# Patient Record
Sex: Male | Born: 1945 | Race: Black or African American | Hispanic: No | Marital: Married | Smoking: Former smoker
Health system: Southern US, Community
[De-identification: ages and names within clinical notes are randomized; demographics above are authoritative.]

## PROBLEM LIST (undated history)

## (undated) ENCOUNTER — Emergency Department: Admission: EM | Disposition: A | Discharge status: Still In Hospital | Payer: Medicare Other

## (undated) DIAGNOSIS — I10 Essential (primary) hypertension: Secondary | ICD-10-CM

## (undated) DIAGNOSIS — F329 Major depressive disorder, single episode, unspecified: Secondary | ICD-10-CM

## (undated) DIAGNOSIS — F32A Depression, unspecified: Secondary | ICD-10-CM

## (undated) DIAGNOSIS — M792 Neuralgia and neuritis, unspecified: Secondary | ICD-10-CM

## (undated) DIAGNOSIS — M47812 Spondylosis without myelopathy or radiculopathy, cervical region: Secondary | ICD-10-CM

## (undated) HISTORY — DX: Neuralgia and neuritis, unspecified: M79.2

## (undated) HISTORY — PX: NO PAST SURGERIES: SHX2092

## (undated) HISTORY — DX: Spondylosis without myelopathy or radiculopathy, cervical region: M47.812

---

## 2013-03-16 ENCOUNTER — Emergency Department (HOSPITAL_COMMUNITY)
Admission: EM | Admit: 2013-03-16 | Discharge: 2013-03-16 | Discharge status: Against Medical Advice | Payer: Medicare HMO | Attending: Emergency Medicine | Admitting: Emergency Medicine

## 2013-03-16 ENCOUNTER — Encounter (HOSPITAL_COMMUNITY): Payer: Self-pay | Admitting: Emergency Medicine

## 2013-03-16 DIAGNOSIS — I10 Essential (primary) hypertension: Secondary | ICD-10-CM | POA: Insufficient documentation

## 2013-03-16 HISTORY — DX: Essential (primary) hypertension: I10

## 2013-03-16 NOTE — ED Notes (Signed)
Presents with hypertension, denies headache, denies blurred vision, denies pain. Pt has not taken bp medication in one month due to not having any, primary care doctor would not refill. Pt does not know name or dose of BP medication. bp 191/120.

## 2014-02-26 DIAGNOSIS — Z125 Encounter for screening for malignant neoplasm of prostate: Secondary | ICD-10-CM | POA: Diagnosis not present

## 2014-02-26 DIAGNOSIS — E559 Vitamin D deficiency, unspecified: Secondary | ICD-10-CM | POA: Diagnosis not present

## 2014-02-26 DIAGNOSIS — E669 Obesity, unspecified: Secondary | ICD-10-CM | POA: Diagnosis not present

## 2014-02-26 DIAGNOSIS — E119 Type 2 diabetes mellitus without complications: Secondary | ICD-10-CM | POA: Diagnosis not present

## 2014-02-26 DIAGNOSIS — I10 Essential (primary) hypertension: Secondary | ICD-10-CM | POA: Diagnosis not present

## 2014-02-26 DIAGNOSIS — E785 Hyperlipidemia, unspecified: Secondary | ICD-10-CM | POA: Diagnosis not present

## 2014-03-03 DIAGNOSIS — I10 Essential (primary) hypertension: Secondary | ICD-10-CM | POA: Diagnosis not present

## 2014-03-04 DIAGNOSIS — I10 Essential (primary) hypertension: Secondary | ICD-10-CM | POA: Diagnosis not present

## 2014-05-26 DIAGNOSIS — E785 Hyperlipidemia, unspecified: Secondary | ICD-10-CM | POA: Diagnosis not present

## 2014-05-26 DIAGNOSIS — I10 Essential (primary) hypertension: Secondary | ICD-10-CM | POA: Diagnosis not present

## 2014-05-26 DIAGNOSIS — E119 Type 2 diabetes mellitus without complications: Secondary | ICD-10-CM | POA: Diagnosis not present

## 2014-05-26 DIAGNOSIS — E669 Obesity, unspecified: Secondary | ICD-10-CM | POA: Diagnosis not present

## 2014-06-17 DIAGNOSIS — E119 Type 2 diabetes mellitus without complications: Secondary | ICD-10-CM | POA: Diagnosis not present

## 2014-06-17 DIAGNOSIS — R05 Cough: Secondary | ICD-10-CM | POA: Diagnosis not present

## 2014-06-17 DIAGNOSIS — I10 Essential (primary) hypertension: Secondary | ICD-10-CM | POA: Diagnosis not present

## 2014-06-29 DIAGNOSIS — R05 Cough: Secondary | ICD-10-CM | POA: Diagnosis not present

## 2014-06-29 DIAGNOSIS — I10 Essential (primary) hypertension: Secondary | ICD-10-CM | POA: Diagnosis not present

## 2014-06-29 DIAGNOSIS — E119 Type 2 diabetes mellitus without complications: Secondary | ICD-10-CM | POA: Diagnosis not present

## 2014-07-13 DIAGNOSIS — E669 Obesity, unspecified: Secondary | ICD-10-CM | POA: Diagnosis not present

## 2014-07-13 DIAGNOSIS — E119 Type 2 diabetes mellitus without complications: Secondary | ICD-10-CM | POA: Diagnosis not present

## 2014-07-13 DIAGNOSIS — I10 Essential (primary) hypertension: Secondary | ICD-10-CM | POA: Diagnosis not present

## 2014-07-20 DIAGNOSIS — F250 Schizoaffective disorder, bipolar type: Secondary | ICD-10-CM | POA: Diagnosis not present

## 2014-09-20 ENCOUNTER — Inpatient Hospital Stay
Admission: EM | Admit: 2014-09-20 | Discharge: 2014-09-21 | DRG: 690 | Disposition: A | Discharge status: Home / Self Care | Payer: Medicare Other | Attending: Internal Medicine | Admitting: Internal Medicine

## 2014-09-20 ENCOUNTER — Emergency Department: Payer: Medicare Other

## 2014-09-20 ENCOUNTER — Encounter: Payer: Self-pay | Admitting: Emergency Medicine

## 2014-09-20 DIAGNOSIS — E860 Dehydration: Secondary | ICD-10-CM | POA: Diagnosis present

## 2014-09-20 DIAGNOSIS — N400 Enlarged prostate without lower urinary tract symptoms: Secondary | ICD-10-CM | POA: Diagnosis present

## 2014-09-20 DIAGNOSIS — Z87891 Personal history of nicotine dependence: Secondary | ICD-10-CM | POA: Diagnosis not present

## 2014-09-20 DIAGNOSIS — A419 Sepsis, unspecified organism: Secondary | ICD-10-CM | POA: Diagnosis present

## 2014-09-20 DIAGNOSIS — M545 Low back pain, unspecified: Secondary | ICD-10-CM

## 2014-09-20 DIAGNOSIS — R05 Cough: Secondary | ICD-10-CM | POA: Diagnosis not present

## 2014-09-20 DIAGNOSIS — R6520 Severe sepsis without septic shock: Secondary | ICD-10-CM | POA: Diagnosis not present

## 2014-09-20 DIAGNOSIS — N289 Disorder of kidney and ureter, unspecified: Secondary | ICD-10-CM | POA: Diagnosis present

## 2014-09-20 DIAGNOSIS — Z79899 Other long term (current) drug therapy: Secondary | ICD-10-CM | POA: Diagnosis not present

## 2014-09-20 DIAGNOSIS — F329 Major depressive disorder, single episode, unspecified: Secondary | ICD-10-CM | POA: Diagnosis present

## 2014-09-20 DIAGNOSIS — N179 Acute kidney failure, unspecified: Secondary | ICD-10-CM | POA: Diagnosis present

## 2014-09-20 DIAGNOSIS — R509 Fever, unspecified: Secondary | ICD-10-CM

## 2014-09-20 DIAGNOSIS — R200 Anesthesia of skin: Secondary | ICD-10-CM | POA: Diagnosis not present

## 2014-09-20 DIAGNOSIS — N12 Tubulo-interstitial nephritis, not specified as acute or chronic: Secondary | ICD-10-CM | POA: Diagnosis present

## 2014-09-20 DIAGNOSIS — F339 Major depressive disorder, recurrent, unspecified: Secondary | ICD-10-CM | POA: Diagnosis present

## 2014-09-20 DIAGNOSIS — Z8249 Family history of ischemic heart disease and other diseases of the circulatory system: Secondary | ICD-10-CM | POA: Diagnosis not present

## 2014-09-20 DIAGNOSIS — Z801 Family history of malignant neoplasm of trachea, bronchus and lung: Secondary | ICD-10-CM | POA: Diagnosis not present

## 2014-09-20 DIAGNOSIS — N390 Urinary tract infection, site not specified: Secondary | ICD-10-CM

## 2014-09-20 DIAGNOSIS — N3000 Acute cystitis without hematuria: Secondary | ICD-10-CM | POA: Diagnosis present

## 2014-09-20 DIAGNOSIS — M5441 Lumbago with sciatica, right side: Secondary | ICD-10-CM | POA: Diagnosis present

## 2014-09-20 DIAGNOSIS — I10 Essential (primary) hypertension: Secondary | ICD-10-CM | POA: Diagnosis present

## 2014-09-20 DIAGNOSIS — N3090 Cystitis, unspecified without hematuria: Secondary | ICD-10-CM | POA: Diagnosis not present

## 2014-09-20 DIAGNOSIS — D72829 Elevated white blood cell count, unspecified: Secondary | ICD-10-CM

## 2014-09-20 HISTORY — DX: Depression, unspecified: F32.A

## 2014-09-20 HISTORY — DX: Major depressive disorder, single episode, unspecified: F32.9

## 2014-09-20 LAB — CBC WITH DIFFERENTIAL/PLATELET
BASOS PCT: 0 %
Basophils Absolute: 0 10*3/uL (ref 0–0.1)
Eosinophils Absolute: 0 10*3/uL (ref 0–0.7)
Eosinophils Relative: 0 %
HCT: 42.8 % (ref 40.0–52.0)
Hemoglobin: 14.4 g/dL (ref 13.0–18.0)
LYMPHS ABS: 0.9 10*3/uL — AB (ref 1.0–3.6)
Lymphocytes Relative: 4 %
MCH: 27.4 pg (ref 26.0–34.0)
MCHC: 33.6 g/dL (ref 32.0–36.0)
MCV: 81.4 fL (ref 80.0–100.0)
MONO ABS: 2.2 10*3/uL — AB (ref 0.2–1.0)
Monocytes Relative: 10 %
NEUTROS ABS: 19.1 10*3/uL — AB (ref 1.4–6.5)
Neutrophils Relative %: 86 %
Platelets: 284 10*3/uL (ref 150–440)
RBC: 5.25 MIL/uL (ref 4.40–5.90)
RDW: 13.9 % (ref 11.5–14.5)
WBC: 22.2 10*3/uL — ABNORMAL HIGH (ref 3.8–10.6)

## 2014-09-20 LAB — BASIC METABOLIC PANEL
Anion gap: 11 (ref 5–15)
BUN: 41 mg/dL — AB (ref 6–20)
CHLORIDE: 102 mmol/L (ref 101–111)
CO2: 23 mmol/L (ref 22–32)
CREATININE: 2.13 mg/dL — AB (ref 0.61–1.24)
Calcium: 9 mg/dL (ref 8.9–10.3)
GFR calc Af Amer: 34 mL/min — ABNORMAL LOW (ref >60)
GFR calc non Af Amer: 30 mL/min — ABNORMAL LOW (ref >60)
Glucose, Bld: 133 mg/dL — ABNORMAL HIGH (ref 65–99)
Potassium: 3.3 mmol/L — ABNORMAL LOW (ref 3.5–5.1)
SODIUM: 136 mmol/L (ref 135–145)

## 2014-09-20 LAB — URINALYSIS COMPLETE WITH MICROSCOPIC (ARMC ONLY)
BILIRUBIN URINE: NEGATIVE
Glucose, UA: NEGATIVE mg/dL
KETONES UR: NEGATIVE mg/dL
NITRITE: POSITIVE — AB
PH: 5 (ref 5.0–8.0)
Protein, ur: NEGATIVE mg/dL
SPECIFIC GRAVITY, URINE: 1.012 (ref 1.005–1.030)

## 2014-09-20 LAB — LACTIC ACID, PLASMA
LACTIC ACID, VENOUS: 1.5 mmol/L (ref 0.5–2.0)
Lactic Acid, Venous: 1.4 mmol/L (ref 0.5–2.0)

## 2014-09-20 LAB — SEDIMENTATION RATE: Sed Rate: 30 mm/hr — ABNORMAL HIGH (ref 0–20)

## 2014-09-20 MED ORDER — SODIUM CHLORIDE 0.9 % IV BOLUS (SEPSIS)
1000.0000 mL | INTRAVENOUS | Status: AC
  Administered 2014-09-20: 1000 mL via INTRAVENOUS

## 2014-09-20 MED ORDER — DEXTROSE 5 % IV SOLN
1.0000 g | INTRAVENOUS | Status: AC
  Administered 2014-09-20: 1 g via INTRAVENOUS
  Filled 2014-09-20: qty 10

## 2014-09-20 NOTE — ED Notes (Signed)
Patient transported to MRI 

## 2014-09-20 NOTE — ED Notes (Signed)
Pt returned from MRI, lactic acid collected and sent.

## 2014-09-20 NOTE — ED Notes (Signed)
Patient transported to X-ray 

## 2014-09-20 NOTE — ED Notes (Signed)
Having lower back pain and numbness to right leg  This am   Hx of samt in past form old injury

## 2014-09-20 NOTE — ED Notes (Signed)
Pt to ed with c/o right leg pain that started this am when he got up to go use the bathroom,  Pt states he felt a "pop" in right leg.

## 2014-09-20 NOTE — H&P (Signed)
[facility] - [place] at [facility]   PATIENT NAME: Calvin Hall    MR#:  [id number]  DATE OF BIRTH:  05-Jan-1945  DATE OF ADMISSION:  09/20/2014  PRIMARY CARE PHYSICIAN: Evelene Croon, MD   REQUESTING/REFERRING PHYSICIAN: York Cerise, MD  CHIEF COMPLAINT:   Chief Complaint  Patient presents with  . Leg Pain    HISTORY OF PRESENT ILLNESS:  Calvin Hall  is a 69 y.o. male who presents with leg pain and back pain. Patient is a poor historian, but came in for right leg pain which he says starts up high around the area of his lower lateral back and wraps around to his foot. In the ED workup included MRI of his lumbar spine due to the same complaint. MRI was not very revealing, the patient stated that his back pain was alleviated somewhat in the ED. However on further workup he was found to have elevated white blood cell count 22, and nitrite positive UA. On further questioning he did reveal he's been having dysuria for about 2 weeks, likely has a longer standing history of BPH as he has been having significant hesitancy when he goes to urinate, along with weak stream, and incomplete voiding. Hospitalists were called for admission for sepsis given the patient's fever and leukocytosis likely due to pyelonephritis given his UTI and back pain.  PAST MEDICAL HISTORY:   Past Medical History  Diagnosis Date  . Hypertension   . Depression     PAST SURGICAL HISTORY:   Past Surgical History  Procedure Laterality Date  . No past surgeries      SOCIAL HISTORY:   Social History  Substance Use Topics  . Smoking status: Former Games developer  . Smokeless tobacco: Not on file  . Alcohol Use: No    FAMILY HISTORY:   Family History  Problem Relation Age of Onset  . Lung cancer Father   . Tuberculosis Mother   . Heart failure Sister     DRUG ALLERGIES:  No Known Allergies  MEDICATIONS AT HOME:   Prior to Admission medications   Medication Sig Start  Date End Date Taking? Authorizing Provider  haloperidol (HALDOL) 2 MG tablet Take 2 mg by mouth at bedtime.   Yes Historical Provider, MD  hydrALAZINE (APRESOLINE) 50 MG tablet Take 50 mg by mouth 2 (two) times daily.   Yes Historical Provider, MD  losartan (COZAAR) 100 MG tablet Take 100 mg by mouth daily.   Yes Historical Provider, MD  sertraline (ZOLOFT) 50 MG tablet Take 50 mg by mouth at bedtime.   Yes Historical Provider, MD  triamterene-hydrochlorothiazide (MAXZIDE) 75-50 MG per tablet Take 1 tablet by mouth daily.   Yes Historical Provider, MD    REVIEW OF SYSTEMS:  Review of Systems  Constitutional: Negative for fever, chills, weight loss and malaise/fatigue.  HENT: Negative for ear pain, hearing loss and tinnitus.   Eyes: Negative for blurred vision, double vision, pain and redness.  Respiratory: Negative for cough, hemoptysis and shortness of breath.   Cardiovascular: Negative for chest pain, palpitations, orthopnea and leg swelling.  Gastrointestinal: Negative for nausea, vomiting, abdominal pain, diarrhea and constipation.  Genitourinary: Positive for dysuria, frequency and flank pain. Negative for hematuria.  Musculoskeletal: Positive for back pain. Negative for joint pain and neck pain.       Right leg pain  Skin:       No acne, rash, or lesions  Neurological: Negative for dizziness, tremors, focal weakness and weakness.  Endo/Heme/Allergies: Negative  for polydipsia. Does not bruise/bleed easily.  Psychiatric/Behavioral: Negative for depression. The patient is not nervous/anxious and does not have insomnia.      VITAL SIGNS:   Filed Vitals:   09/20/14 1400 09/20/14 1430 09/20/14 1616 09/20/14 1858  BP: 124/73 134/88 138/68 116/75  Pulse: 91 91 87 96  Temp:      TempSrc:      Resp:   18 16  Height:      Weight:      SpO2: 98% 95% 99% 98%   Wt Readings from Last 3 Encounters:  09/20/14 126.1 kg (278 lb)  03/16/13 133.953 kg (295 lb 5 oz)    PHYSICAL  EXAMINATION:  Physical Exam  Vitals reviewed. Constitutional: He is oriented to person, place, and time. He appears well-developed and well-nourished. No distress.  HENT:  Head: Normocephalic and atraumatic.  Mouth/Throat: Oropharynx is clear and moist.  Eyes: Conjunctivae and EOM are normal. Pupils are equal, round, and reactive to light. No scleral icterus.  Neck: Normal range of motion. Neck supple. No JVD present. No thyromegaly present.  Cardiovascular: Normal rate, regular rhythm and intact distal pulses.  Exam reveals no gallop and no friction rub.   No murmur heard. Respiratory: Effort normal and breath sounds normal. No respiratory distress. He has no wheezes. He has no rales.  GI: Soft. Bowel sounds are normal. He exhibits no distension. There is tenderness (suprapubic).  Musculoskeletal: Normal range of motion. He exhibits no edema.  Right CVA tenderness. No arthritis, no gout  Lymphadenopathy:    He has no cervical adenopathy.  Neurological: He is alert and oriented to person, place, and time. No cranial nerve deficit.  No dysarthria, no aphasia  Skin: Skin is warm and dry. No rash noted. No erythema.  Psychiatric: He has a normal mood and affect. His behavior is normal. Judgment and thought content normal.    LABORATORY PANEL:   CBC  Recent Labs Lab 09/20/14 1251  WBC 22.2*  HGB 14.4  HCT 42.8  PLT 284   ------------------------------------------------------------------------------------------------------------------  Chemistries   Recent Labs Lab 09/20/14 1251  NA 136  K 3.3*  CL 102  CO2 23  GLUCOSE 133*  BUN 41*  CREATININE 2.13*  CALCIUM 9.0   ------------------------------------------------------------------------------------------------------------------  Cardiac Enzymes No results for input(s): TROPONINI in the last 168  hours. ------------------------------------------------------------------------------------------------------------------  RADIOLOGY:  Dg Chest 2 View  09/20/2014   CLINICAL DATA:  Low back pain, numbness in right leg starting this morning. Cough, leukocytosis.  EXAM: CHEST  2 VIEW  COMPARISON:  None.  FINDINGS: The heart size and mediastinal contours are within normal limits. Both lungs are clear. The visualized skeletal structures are unremarkable.  IMPRESSION: No active cardiopulmonary disease.   Electronically Signed   By: Charlett Nose M.D.   On: 09/20/2014 14:29   Dg Lumbar Spine Complete  09/20/2014   CLINICAL DATA:  Pt to ed with c/o right leg pain that radiates to low back that started this am when he got up to go use the bathroom, Pt states he felt a "pop" in right leg.  EXAM: LUMBAR SPINE - COMPLETE 4+ VIEW  COMPARISON:  None.  FINDINGS: No fracture. No spondylolisthesis. Mild loss of disc height at L3-L4. There are small endplate osteophytes most evident at L3-L4. Remaining discs and facet joints are relatively well preserved. Soft tissues are unremarkable.  IMPRESSION: No fracture or acute finding.  Mild degenerative changes.   Electronically Signed   By: Renard Hamper.D.  On: 09/20/2014 13:13   Dg Pelvis 1-2 Views  09/20/2014   CLINICAL DATA:  Right leg pain radiating to lower back.  EXAM: PELVIS - 1-2 VIEW  COMPARISON:  None.  FINDINGS: Mild symmetric degenerative changes in the hips bilaterally. SI joints are symmetric and unremarkable. No acute bony abnormality. Specifically, no fracture, subluxation, or dislocation. Soft tissues are intact.  IMPRESSION: No acute bony abnormality.   Electronically Signed   By: Charlett Nose M.D.   On: 09/20/2014 13:11   Mr Thoracic Spine Wo Contrast  09/20/2014   CLINICAL DATA:  69 year old hypertensive male with low back pain and right leg numbness starting this morning. Right foot numbness yesterday. Initial encounter.  EXAM: MRI THORACIC AND LUMBAR  SPINE WITHOUT CONTRAST  TECHNIQUE: Multiplanar and multiecho pulse sequences of the thoracic and lumbar spine were obtained without intravenous contrast.  COMPARISON:  None.  FINDINGS: MR THORACIC SPINE FINDINGS  On the single sagittal sequence obtained for proper level assignment which includes cervical spine, cervical spondylotic changes with baseline narrowed cervical spinal canal is noted but incompletely assessed.  Decreased signal intensity of bone marrow may be related to patient's habitus. Correlation with CBC to exclude anemia contributing to this appearance may be considered  Minimal thoracic degenerative changes without evidence of thoracic disc herniation, significant spinal stenosis or foraminal narrowing. No thoracic cord compression.  Slight ectasia thoracic aorta.  MR LUMBAR SPINE FINDINGS  Exam is motion degraded.  Last fully open disk space is labeled L5-S1. Present examination incorporates from T12-L1 disc space through the S3 level.  Conus L1 level.  Decreased signal intensity of bone marrow may be related to patient's habitus. Correlation with CBC to exclude anemia contributing to this appearance may be considered.  Visualized paravertebral structures unremarkable. Mild dependent subcutaneous edema incidentally noted.  L1-2:  Mild facet bony overgrowth.  L2-3: Mild facet bony overgrowth. Slightly short pedicles. Minimal bulge. Mild spinal stenosis.  L3-4: Facet degenerative changes. Bulge. Short pedicles. Mild to slightly moderate spinal stenosis and bilateral foraminal narrowing.  L4-5: Prominent facet joint degenerative changes. Bulge. Short pedicles. Ligamentum flavum hypertrophy. Baseline mild spinal stenosis, moderate bilateral lateral recess stenosis and moderate foraminal narrowing greater on the right. Superimposed moderate size right paracentral disc protrusion with significant compression of the right lateral aspect of the thecal sac and origin of the right L5 nerve root.  L5-S1:  Left-sided pars defect suspected. Prominent facet joint degenerative changes greater on the right. Minimal anterior slip L5. Bulge slightly greater to the right. Mild to moderate foraminal narrowing greater on the right. Mild right lateral recess stenosis.  IMPRESSION: MR THORACIC SPINE IMPRESSION  Minimal thoracic degenerative changes without evidence of thoracic disc herniation, significant spinal stenosis or foraminal narrowing. No thoracic cord compression.  Cervical spondylotic changes incompletely assessed on present exam.  MR LUMBAR SPINE IMPRESSION  Summary of pertinent findings includes:  L4-5 baseline multifactorial mild spinal stenosis, moderate bilateral lateral recess stenosis and moderate foraminal narrowing greater on the right. Superimposed moderate size right paracentral disc protrusion with significant compression of the right lateral aspect of the thecal sac and origin of the right L5 nerve root.  L3-4 multifactorial mild to slightly moderate spinal stenosis and bilateral foraminal narrowing.  L5-S1 multifactorial mild to moderate foraminal narrowing greater on the right. Mild right lateral recess stenosis.  L2-3 multifactorial mild spinal stenosis.  Please note that the exam was performed without contrast after emergency room physician discussed case with another radiologist and it was elected not to  give contrast secondary to low GFR (34). This limits evaluation for detection of subtle infection. Taking this limitation into account, no obvious epidural abscess. Fluid/ edema involving the L3-4, L4-5 and L5-S1 facet joints statistically is related to degenerative changes rather than facet infectious arthropathy. Close follow-up imaging recommended if patient has progressive symptoms suggesting infection.  Decreased signal intensity of bone marrow may be related to patient's habitus. Correlation with CBC to exclude anemia contributing to this appearance may be considered.   Electronically Signed    By: Lacy Duverney M.D.   On: 09/20/2014 19:13   Mr Lumbar Spine Wo Contrast  09/20/2014   CLINICAL DATA:  69 year old hypertensive male with low back pain and right leg numbness starting this morning. Right foot numbness yesterday. Initial encounter.  EXAM: MRI THORACIC AND LUMBAR SPINE WITHOUT CONTRAST  TECHNIQUE: Multiplanar and multiecho pulse sequences of the thoracic and lumbar spine were obtained without intravenous contrast.  COMPARISON:  None.  FINDINGS: MR THORACIC SPINE FINDINGS  On the single sagittal sequence obtained for proper level assignment which includes cervical spine, cervical spondylotic changes with baseline narrowed cervical spinal canal is noted but incompletely assessed.  Decreased signal intensity of bone marrow may be related to patient's habitus. Correlation with CBC to exclude anemia contributing to this appearance may be considered  Minimal thoracic degenerative changes without evidence of thoracic disc herniation, significant spinal stenosis or foraminal narrowing. No thoracic cord compression.  Slight ectasia thoracic aorta.  MR LUMBAR SPINE FINDINGS  Exam is motion degraded.  Last fully open disk space is labeled L5-S1. Present examination incorporates from T12-L1 disc space through the S3 level.  Conus L1 level.  Decreased signal intensity of bone marrow may be related to patient's habitus. Correlation with CBC to exclude anemia contributing to this appearance may be considered.  Visualized paravertebral structures unremarkable. Mild dependent subcutaneous edema incidentally noted.  L1-2:  Mild facet bony overgrowth.  L2-3: Mild facet bony overgrowth. Slightly short pedicles. Minimal bulge. Mild spinal stenosis.  L3-4: Facet degenerative changes. Bulge. Short pedicles. Mild to slightly moderate spinal stenosis and bilateral foraminal narrowing.  L4-5: Prominent facet joint degenerative changes. Bulge. Short pedicles. Ligamentum flavum hypertrophy. Baseline mild spinal stenosis,  moderate bilateral lateral recess stenosis and moderate foraminal narrowing greater on the right. Superimposed moderate size right paracentral disc protrusion with significant compression of the right lateral aspect of the thecal sac and origin of the right L5 nerve root.  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 Prominent facet joint degenerative changes greater on the right. Minimal anterior slip L5. Bulge slightly greater to the right. Mild to moderate foraminal narrowing greater on the right. Mild right lateral recess stenosis.  IMPRESSION: MR THORACIC SPINE IMPRESSION  Minimal thoracic degenerative changes without evidence of thoracic disc herniation, significant spinal stenosis or foraminal narrowing. No thoracic cord compression.  Cervical spondylotic changes incompletely assessed on present exam.  MR LUMBAR SPINE IMPRESSION  Summary of pertinent findings includes:  L4-5 baseline multifactorial mild spinal stenosis, moderate bilateral lateral recess stenosis and moderate foraminal narrowing greater on the right. Superimposed moderate size right paracentral disc protrusion with significant compression of the right lateral aspect of the thecal sac and origin of the right L5 nerve root.  L3-4 multifactorial mild to slightly moderate spinal stenosis and bilateral foraminal narrowing.  L5-S1 multifactorial mild to moderate foraminal narrowing greater on the right. Mild right lateral recess stenosis.  L2-3 multifactorial mild spinal stenosis.  Please note that the exam was performed without contrast after emergency  room physician discussed case with another radiologist and it was elected not to give contrast secondary to low GFR (34). This limits evaluation for detection of subtle infection. Taking this limitation into account, no obvious epidural abscess. Fluid/ edema involving the L3-4, L4-5 and L5-S1 facet joints statistically is related to degenerative changes rather than facet infectious arthropathy. Close  follow-up imaging recommended if patient has progressive symptoms suggesting infection.  Decreased signal intensity of bone marrow may be related to patient's habitus. Correlation with CBC to exclude anemia contributing to this appearance may be considered.   Electronically Signed   By: Lacy Duverney M.D.   On: 09/20/2014 19:13    EKG:  No orders found for this or any previous visit.  IMPRESSION AND PLAN:  Principal Problem:   Sepsis - lactic acid normal, hemodynamically stable, does have some mild aki, see below for this. We'll admit with Rocephin for antibiotics, blood and urine cultures sent, fluids tonight for aki, see below. Active Problems:   Pyelonephritis - antibiotics as above, urine culture sent.   AKI (acute kidney injury) - likely due to pyelonephritis, potentially due to sepsis the patient's blood pressure has not been low. Gentle fluids tonight for the same. Patient denies any history of chronic renal disease. Avoid nephrotoxins   HTN (hypertension) - mildly elevated here, continue home antihypertensives except for ARB and diuretic due to AKI, see above   Depression - continue home meds  All the records are reviewed and case discussed with ED provider. Management plans discussed with the patient and/or family.  DVT PROPHYLAXIS: SubQ heparin  ADMISSION STATUS: Inpatient  CODE STATUS: Full  TOTAL TIME TAKING CARE OF THIS PATIENT: 45 minutes.    WILLIS, DAVID FIELDING 09/20/2014, 9:05 PM  [employer]  Office  [phone]  CC: Primary care physician; Evelene Croon, MD

## 2014-09-20 NOTE — ED Provider Notes (Signed)
[facility] Emergency Department Provider Note  ____________________________________________  Time seen: Approximately 12:07 PM  I have reviewed the triage vital signs and the nursing notes.   HISTORY  Chief Complaint Leg Pain    HPI Daymon Hora is a 69 y.o. male with a history of hypertension and a "broken vertebra in my lower back" many years ago who presents with acute onset of right pelvis pain and leg pain that radiates from his right buttock down the back of his leg to his foot.  He states that he got up during the night to go to the bathroom and while he was standing washing his hands he felt a "pop".  The pain is sharp and radiates down all the way to his foot.  It is not making it difficult for him to ambulate.  He has had no urinary retention, inability to urinate, or urinary incontinence.  He has also had no fecal incontinence or difficulty having a bowel movement.  He denies any other pain or symptoms, although his wife notes that when he checked into the emergency department his temperature was slightly elevated at 100.4.  However he has not felt any fever/chills, chest pain, shortness of breath, cough/congestion, nausea/vomiting, abdominal pain, or dysuria.He describes the pain in his leg as moderate and worse with movement.  He does not have any numbness or tingling.  He does not have any pain in his lower back and describes the pain as starting in the middle of his right buttocks/pelvis.     Past Medical History  Diagnosis Date  . Hypertension     There are no active problems to display for this patient.   History reviewed. No pertinent past surgical history.  Current Outpatient Rx  Name  Route  Sig  Dispense  Refill  . cholecalciferol (VITAMIN D) 1000 UNITS tablet   Oral   Take 1,000 Units by mouth daily.         . Telmisartan-Amlodipine (TWYNSTA) 80-10 MG TABS   Oral   Take 1 tablet by mouth daily.         . vitamin C  (ASCORBIC ACID) 500 MG tablet   Oral   Take 500 mg by mouth daily.           Allergies Review of patient's allergies indicates no known allergies.  History reviewed. No pertinent family history.  Social History Social History  Substance Use Topics  . Smoking status: Never Smoker   . Smokeless tobacco: None  . Alcohol Use: No    Review of Systems Constitutional: No fever/chills, though the patient's temperature is 100.4 on check in Eyes: No visual changes. ENT: No sore throat. Cardiovascular: Denies chest pain. Respiratory: Denies shortness of breath. Gastrointestinal: No abdominal pain.  No nausea, no vomiting.  No diarrhea.  No constipation.  No fecal incontinence. Genitourinary: Negative for dysuria.  No urinary incontinence/retention. Musculoskeletal: Negative for back pain.  Having moderate acute onset of pain in the right side of his posterior pelvis radiating down the back of his right leg Skin: Negative for rash. Neurological: Negative for headaches, focal weakness or numbness including in a saddle distribution.  10-point ROS otherwise negative.  ____________________________________________   PHYSICAL EXAM:  VITAL SIGNS: ED Triage Vitals  Enc Vitals Group     BP 09/20/14 1048 124/67 mmHg     Pulse Rate 09/20/14 1048 85     Resp 09/20/14 1048 18     Temp 09/20/14 1048 100.4 F (38 C)  Temp Source 09/20/14 1048 Oral     SpO2 09/20/14 1048 98 %     Weight 09/20/14 1048 278 lb (126.1 kg)     Height 09/20/14 1048  (1.854 m)     Head Cir --      Peak Flow --      Pain Score 09/20/14 1055 9     Pain Loc --      Pain Edu? --      Excl. in GC? --     Constitutional: Alert and oriented. Well appearing and in no acute distress. Eyes: Conjunctivae are normal. PERRL. EOMI. Head: Atraumatic. Nose: No congestion/rhinnorhea. Mouth/Throat: Mucous membranes are moist.  Oropharynx non-erythematous. Neck: No stridor.   Cardiovascular: Normal rate, regular  rhythm. Grossly normal heart sounds.  Good peripheral circulation. Respiratory: Normal respiratory effort.  No retractions. Lungs CTAB. Gastrointestinal: Soft and nontender. No distention. No abdominal bruits. No CVA tenderness. Musculoskeletal: The patient has 5 out of 5 strength in bilateral upper and lower extremities with flexion and extension as well as abduction/adduction of major muscle groups.  He has sensation intact to light touch all the way down both of his lower extremities.  He has equal 2+ patellar reflexes bilaterally.  He has no Babinski's sign bilaterally.  He has no tenderness to palpation along the entire length of his spine, from his cervical spine through his thoracic spine and including his lumbar spine.  There are no step-offs or deformities along his spine and no swelling, tenderness, or erythema on either side of the spine.  He has mild point tenderness to palpation in the right posterior pelvis with pain that radiates down the back of his right leg. Neurologic:  Normal speech and language. No gross focal neurologic deficits are appreciated.  The patient is able to ambulate with no difficulties.  He has no foot drop. Skin:  Skin is warm, dry and intact. No rash noted. Psychiatric: Mood and affect are normal. Speech and behavior are normal.  ____________________________________________   LABS (all labs ordered are listed, but only abnormal results are displayed)  Labs Reviewed  URINALYSIS COMPLETEWITH MICROSCOPIC ([facility] ONLY) - Abnormal; Notable for the following:    Color, Urine YELLOW (*)    APPearance CLOUDY (*)    Hgb urine dipstick 1+ (*)    Nitrite POSITIVE (*)    Leukocytes, UA 3+ (*)    Bacteria, UA FEW (*)    Squamous Epithelial / LPF 0-5 (*)    All other components within normal limits  SEDIMENTATION RATE - Abnormal; Notable for the following:    Sed Rate 30 (*)    All other components within normal limits  CBC WITH DIFFERENTIAL/PLATELET - Abnormal;  Notable for the following:    WBC 22.2 (*)    Neutro Abs 19.1 (*)    Lymphs Abs 0.9 (*)    Monocytes Absolute 2.2 (*)    All other components within normal limits  BASIC METABOLIC PANEL - Abnormal; Notable for the following:    Potassium 3.3 (*)    Glucose, Bld 133 (*)    BUN 41 (*)    Creatinine, Ser 2.13 (*)    GFR calc non Af Amer 30 (*)    GFR calc Af Amer 34 (*)    All other components within normal limits  URINE CULTURE  CULTURE, BLOOD (ROUTINE X 2)  CULTURE, BLOOD (ROUTINE X 2)  LACTIC ACID, PLASMA  C-REACTIVE PROTEIN  LACTIC ACID, PLASMA   ____________________________________________  EKG  Not indicated ____________________________________________  RADIOLOGY   Dg Chest 2 View  09/20/2014   CLINICAL DATA:  Low back pain, numbness in right leg starting this morning. Cough, leukocytosis.  EXAM: CHEST  2 VIEW  COMPARISON:  None.  FINDINGS: The heart size and mediastinal contours are within normal limits. Both lungs are clear. The visualized skeletal structures are unremarkable.  IMPRESSION: No active cardiopulmonary disease.   Electronically Signed   By: Charlett Nose M.D.   On: 09/20/2014 14:29   Dg Lumbar Spine Complete  09/20/2014   CLINICAL DATA:  Pt to ed with c/o right leg pain that radiates to low back that started this am when he got up to go use the bathroom, Pt states he felt a "pop" in right leg.  EXAM: LUMBAR SPINE - COMPLETE 4+ VIEW  COMPARISON:  None.  FINDINGS: No fracture. No spondylolisthesis. Mild loss of disc height at L3-L4. There are small endplate osteophytes most evident at L3-L4. Remaining discs and facet joints are relatively well preserved. Soft tissues are unremarkable.  IMPRESSION: No fracture or acute finding.  Mild degenerative changes.   Electronically Signed   By: Amie Portland M.D.   On: 09/20/2014 13:13   Dg Pelvis 1-2 Views  09/20/2014   CLINICAL DATA:  Right leg pain radiating to lower back.  EXAM: PELVIS - 1-2 VIEW  COMPARISON:  None.   FINDINGS: Mild symmetric degenerative changes in the hips bilaterally. SI joints are symmetric and unremarkable. No acute bony abnormality. Specifically, no fracture, subluxation, or dislocation. Soft tissues are intact.  IMPRESSION: No acute bony abnormality.   Electronically Signed   By: Charlett Nose M.D.   On: 09/20/2014 13:11   Mr Thoracic Spine Wo Contrast  09/20/2014   CLINICAL DATA:  69 year old hypertensive male with low back pain and right leg numbness starting this morning. Right foot numbness yesterday. Initial encounter.  EXAM: MRI THORACIC AND LUMBAR SPINE WITHOUT CONTRAST  TECHNIQUE: Multiplanar and multiecho pulse sequences of the thoracic and lumbar spine were obtained without intravenous contrast.  COMPARISON:  None.  FINDINGS: MR THORACIC SPINE FINDINGS  On the single sagittal sequence obtained for proper level assignment which includes cervical spine, cervical spondylotic changes with baseline narrowed cervical spinal canal is noted but incompletely assessed.  Decreased signal intensity of bone marrow may be related to patient's habitus. Correlation with CBC to exclude anemia contributing to this appearance may be considered  Minimal thoracic degenerative changes without evidence of thoracic disc herniation, significant spinal stenosis or foraminal narrowing. No thoracic cord compression.  Slight ectasia thoracic aorta.  MR LUMBAR SPINE FINDINGS  Exam is motion degraded.  Last fully open disk space is labeled L5-S1. Present examination incorporates from T12-L1 disc space through the S3 level.  Conus L1 level.  Decreased signal intensity of bone marrow may be related to patient's habitus. Correlation with CBC to exclude anemia contributing to this appearance may be considered.  Visualized paravertebral structures unremarkable. Mild dependent subcutaneous edema incidentally noted.  L1-2:  Mild facet bony overgrowth.  L2-3: Mild facet bony overgrowth. Slightly short pedicles. Minimal bulge. Mild  spinal stenosis.  L3-4: Facet degenerative changes. Bulge. Short pedicles. Mild to slightly moderate spinal stenosis and bilateral foraminal narrowing.  L4-5: Prominent facet joint degenerative changes. Bulge. Short pedicles. Ligamentum flavum hypertrophy. Baseline mild spinal stenosis, moderate bilateral lateral recess stenosis and moderate foraminal narrowing greater on the right. Superimposed moderate size right paracentral disc protrusion with significant compression of the right lateral aspect of the thecal  sac and origin of the right L5 nerve root.  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 Prominent facet joint degenerative changes greater on the right. Minimal anterior slip L5. Bulge slightly greater to the right. Mild to moderate foraminal narrowing greater on the right. Mild right lateral recess stenosis.  IMPRESSION: MR THORACIC SPINE IMPRESSION  Minimal thoracic degenerative changes without evidence of thoracic disc herniation, significant spinal stenosis or foraminal narrowing. No thoracic cord compression.  Cervical spondylotic changes incompletely assessed on present exam.  MR LUMBAR SPINE IMPRESSION  Summary of pertinent findings includes:  L4-5 baseline multifactorial mild spinal stenosis, moderate bilateral lateral recess stenosis and moderate foraminal narrowing greater on the right. Superimposed moderate size right paracentral disc protrusion with significant compression of the right lateral aspect of the thecal sac and origin of the right L5 nerve root.  L3-4 multifactorial mild to slightly moderate spinal stenosis and bilateral foraminal narrowing.  L5-S1 multifactorial mild to moderate foraminal narrowing greater on the right. Mild right lateral recess stenosis.  L2-3 multifactorial mild spinal stenosis.  Please note that the exam was performed without contrast after emergency room physician discussed case with another radiologist and it was elected not to give contrast secondary to low GFR  (34). This limits evaluation for detection of subtle infection. Taking this limitation into account, no obvious epidural abscess. Fluid/ edema involving the L3-4, L4-5 and L5-S1 facet joints statistically is related to degenerative changes rather than facet infectious arthropathy. Close follow-up imaging recommended if patient has progressive symptoms suggesting infection.  Decreased signal intensity of bone marrow may be related to patient's habitus. Correlation with CBC to exclude anemia contributing to this appearance may be considered.   Electronically Signed   By: Lacy Duverney M.D.   On: 09/20/2014 19:13   Mr Lumbar Spine Wo Contrast  09/20/2014   CLINICAL DATA:  69 year old hypertensive male with low back pain and right leg numbness starting this morning. Right foot numbness yesterday. Initial encounter.  EXAM: MRI THORACIC AND LUMBAR SPINE WITHOUT CONTRAST  TECHNIQUE: Multiplanar and multiecho pulse sequences of the thoracic and lumbar spine were obtained without intravenous contrast.  COMPARISON:  None.  FINDINGS: MR THORACIC SPINE FINDINGS  On the single sagittal sequence obtained for proper level assignment which includes cervical spine, cervical spondylotic changes with baseline narrowed cervical spinal canal is noted but incompletely assessed.  Decreased signal intensity of bone marrow may be related to patient's habitus. Correlation with CBC to exclude anemia contributing to this appearance may be considered  Minimal thoracic degenerative changes without evidence of thoracic disc herniation, significant spinal stenosis or foraminal narrowing. No thoracic cord compression.  Slight ectasia thoracic aorta.  MR LUMBAR SPINE FINDINGS  Exam is motion degraded.  Last fully open disk space is labeled L5-S1. Present examination incorporates from T12-L1 disc space through the S3 level.  Conus L1 level.  Decreased signal intensity of bone marrow may be related to patient's habitus. Correlation with CBC to  exclude anemia contributing to this appearance may be considered.  Visualized paravertebral structures unremarkable. Mild dependent subcutaneous edema incidentally noted.  L1-2:  Mild facet bony overgrowth.  L2-3: Mild facet bony overgrowth. Slightly short pedicles. Minimal bulge. Mild spinal stenosis.  L3-4: Facet degenerative changes. Bulge. Short pedicles. Mild to slightly moderate spinal stenosis and bilateral foraminal narrowing.  L4-5: Prominent facet joint degenerative changes. Bulge. Short pedicles. Ligamentum flavum hypertrophy. Baseline mild spinal stenosis, moderate bilateral lateral recess stenosis and moderate foraminal narrowing greater on the right. Superimposed moderate size right paracentral  disc protrusion with significant compression of the right lateral aspect of the thecal sac and origin of the right L5 nerve root.  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 Prominent facet joint degenerative changes greater on the right. Minimal anterior slip L5. Bulge slightly greater to the right. Mild to moderate foraminal narrowing greater on the right. Mild right lateral recess stenosis.  IMPRESSION: MR THORACIC SPINE IMPRESSION  Minimal thoracic degenerative changes without evidence of thoracic disc herniation, significant spinal stenosis or foraminal narrowing. No thoracic cord compression.  Cervical spondylotic changes incompletely assessed on present exam.  MR LUMBAR SPINE IMPRESSION  Summary of pertinent findings includes:  L4-5 baseline multifactorial mild spinal stenosis, moderate bilateral lateral recess stenosis and moderate foraminal narrowing greater on the right. Superimposed moderate size right paracentral disc protrusion with significant compression of the right lateral aspect of the thecal sac and origin of the right L5 nerve root.  L3-4 multifactorial mild to slightly moderate spinal stenosis and bilateral foraminal narrowing.  L5-S1 multifactorial mild to moderate foraminal narrowing greater  on the right. Mild right lateral recess stenosis.  L2-3 multifactorial mild spinal stenosis.  Please note that the exam was performed without contrast after emergency room physician discussed case with another radiologist and it was elected not to give contrast secondary to low GFR (34). This limits evaluation for detection of subtle infection. Taking this limitation into account, no obvious epidural abscess. Fluid/ edema involving the L3-4, L4-5 and L5-S1 facet joints statistically is related to degenerative changes rather than facet infectious arthropathy. Close follow-up imaging recommended if patient has progressive symptoms suggesting infection.  Decreased signal intensity of bone marrow may be related to patient's habitus. Correlation with CBC to exclude anemia contributing to this appearance may be considered.   Electronically Signed   By: Lacy Duverney M.D.   On: 09/20/2014 19:13    ____________________________________________   PROCEDURES  Procedure(s) performed: None  Critical Care performed: No ____________________________________________   INITIAL IMPRESSION / ASSESSMENT AND PLAN / ED COURSE  Pertinent labs & imaging results that were available during my care of the patient were reviewed by me and considered in my medical decision making (see chart for details).  The patient is well-appearing and in no acute distress.  All of his signs and symptoms are consistent with acute onset of sciatica.  I am going to further evaluate him for 2 reasons: His history of nonspecific vertebral fracture which according to his history sounded spontaneous in which he never followed up on, as well as his low-grade fever at the time of triage.  I have no concerns for epidural abscess at this time as he has no risk factors and his vital signs are stable with no back pain or neurological deficits.  I am going to obtain plain films of his lumbar spine and his pelvis to look for any pathological lesions as  well as obtain a urinalysis and basic blood work including inflammatory markers (sedimentation rate and CRP) which should help me determine if advanced imaging is needed.  However at this time, with no focal neurological deficits, no back pain, no evidence of cauda equina, significant mechanism, and no vital sign abnormalities other than a low-grade fever, I suspect the patient will be appropriate for outpatient follow-up with orthopedics.\  ----------------------------------------- 2:52 PM on 09/20/2014 -----------------------------------------  On reassessment the patient is having no pain in his lower extremity except for pain on the top of his right foot.  He continues to have no pain in his back  and is ambulating without difficulty.  However, his labs are concerning in that he has a leukocytosis of 22 and his chest x-ray is unremarkable.  I am awaiting results of the urinalysis.  ----------------------------------------- 3:41 PM on 09/20/2014 -----------------------------------------  The patient has a grossly infected urinalysis suggestive of Escherichia coli infection.  However I remain concerned about the fact that the patient has acute renal failure, a leukocytosis of 22, low-grade fever, and back/pelvis pain.  I feel that I must rule out epidural abscess given this constellation of symptoms.  I discussed with the neuroradiologist at [facility] and he recommended MRI without IV contrast given that the patient's GFR is currently 34.  We will proceed with the plan.  I updated the patient and am treating his urinary tract infection with 1 g of ceftriaxone.  He is also receiving 1 L IV of normal saline.  ----------------------------------------- 7:35 PM on 09/20/2014 -----------------------------------------  The patient's MRI is not suggestive of epidural abscess.  Given his acute renal failure, leukocytosis, urinary tract infection, and the fact that he meet sepsis criteria by leukocytosis and  low-grade fever (though it is not severe sepsis based on his normal lactate and his normal blood pressure) I will admit to the hospital for further management.  He received ceftriaxone 1 g IV initially and I am giving him a second gram now as per recommendations for treatment of sepsis due to urinary tract infection.  He does not require the full 35mL/kg NS IV boluses recommended for severe sepsis.  ____________________________________________  FINAL CLINICAL IMPRESSION(S) / ED DIAGNOSES  Final diagnoses:  Low back pain  Leukocytosis  Fever  Acute renal failure, unspecified acute renal failure type  Complicated UTI (urinary tract infection)  Right-sided low back pain with right-sided sciatica  Sepsis, due to unspecified organism      NEW MEDICATIONS STARTED DURING THIS VISIT:  New Prescriptions   No medications on file     Loleta Rose, MD 09/20/14 1936

## 2014-09-21 LAB — BASIC METABOLIC PANEL
Anion gap: 10 (ref 5–15)
BUN: 41 mg/dL — ABNORMAL HIGH (ref 6–20)
CALCIUM: 8.5 mg/dL — AB (ref 8.9–10.3)
CHLORIDE: 106 mmol/L (ref 101–111)
CO2: 22 mmol/L (ref 22–32)
CREATININE: 2 mg/dL — AB (ref 0.61–1.24)
GFR, EST AFRICAN AMERICAN: 37 mL/min — AB (ref >60)
GFR, EST NON AFRICAN AMERICAN: 32 mL/min — AB (ref >60)
Glucose, Bld: 143 mg/dL — ABNORMAL HIGH (ref 65–99)
Potassium: 3.2 mmol/L — ABNORMAL LOW (ref 3.5–5.1)
SODIUM: 138 mmol/L (ref 135–145)

## 2014-09-21 LAB — CBC
HCT: 39 % — ABNORMAL LOW (ref 40.0–52.0)
Hemoglobin: 13 g/dL (ref 13.0–18.0)
MCH: 27.3 pg (ref 26.0–34.0)
MCHC: 33.3 g/dL (ref 32.0–36.0)
MCV: 82.2 fL (ref 80.0–100.0)
PLATELETS: 311 10*3/uL (ref 150–440)
RBC: 4.75 MIL/uL (ref 4.40–5.90)
RDW: 13.8 % (ref 11.5–14.5)
WBC: 19.2 10*3/uL — AB (ref 3.8–10.6)

## 2014-09-21 LAB — C-REACTIVE PROTEIN: CRP: 18 mg/dL — ABNORMAL HIGH (ref <1.0)

## 2014-09-21 MED ORDER — SERTRALINE HCL 50 MG PO TABS
50.0000 mg | ORAL_TABLET | Freq: Every day | ORAL | Status: DC
  Administered 2014-09-21: 50 mg via ORAL
  Filled 2014-09-21: qty 1

## 2014-09-21 MED ORDER — HYDRALAZINE HCL 50 MG PO TABS
50.0000 mg | ORAL_TABLET | Freq: Two times a day (BID) | ORAL | Status: DC
  Administered 2014-09-21 (×2): 50 mg via ORAL
  Filled 2014-09-21 (×2): qty 1

## 2014-09-21 MED ORDER — SENNOSIDES-DOCUSATE SODIUM 8.6-50 MG PO TABS: 1.0000 | ORAL_TABLET | Freq: Every evening | ORAL | Status: DC | PRN

## 2014-09-21 MED ORDER — ACETAMINOPHEN 325 MG PO TABS: 650.0000 mg | ORAL_TABLET | Freq: Four times a day (QID) | ORAL | Status: DC | PRN

## 2014-09-21 MED ORDER — CEFUROXIME AXETIL 500 MG PO TABS: 500.0000 mg | ORAL_TABLET | Freq: Two times a day (BID) | ORAL | Status: DC

## 2014-09-21 MED ORDER — POTASSIUM CHLORIDE CRYS ER 20 MEQ PO TBCR: 40.0000 meq | EXTENDED_RELEASE_TABLET | Freq: Once | ORAL | Status: DC

## 2014-09-21 MED ORDER — HALOPERIDOL 2 MG PO TABS
2.0000 mg | ORAL_TABLET | Freq: Every day | ORAL | Status: DC
  Filled 2014-09-21: qty 1

## 2014-09-21 MED ORDER — LOSARTAN POTASSIUM 100 MG PO TABS: 50.0000 mg | ORAL_TABLET | Freq: Every day | ORAL | Status: DC

## 2014-09-21 MED ORDER — SODIUM CHLORIDE 0.9 % IJ SOLN
3.0000 mL | Freq: Two times a day (BID) | INTRAMUSCULAR | Status: DC
  Administered 2014-09-21: 3 mL via INTRAVENOUS

## 2014-09-21 MED ORDER — ONDANSETRON HCL 4 MG/2ML IJ SOLN: 4.0000 mg | Freq: Four times a day (QID) | INTRAMUSCULAR | Status: DC | PRN

## 2014-09-21 MED ORDER — ACETAMINOPHEN 650 MG RE SUPP: 650.0000 mg | Freq: Four times a day (QID) | RECTAL | Status: DC | PRN

## 2014-09-21 MED ORDER — DEXTROSE 5 % IV SOLN
2.0000 g | INTRAVENOUS | Status: DC
  Filled 2014-09-21: qty 2

## 2014-09-21 MED ORDER — CLONIDINE HCL 0.1 MG PO TABS
ORAL_TABLET | ORAL | Status: AC
  Filled 2014-09-21: qty 1

## 2014-09-21 MED ORDER — SODIUM CHLORIDE 0.9 % IV SOLN
INTRAVENOUS | Status: AC
  Administered 2014-09-21: 01:00:00 via INTRAVENOUS

## 2014-09-21 MED ORDER — HEPARIN SODIUM (PORCINE) 5000 UNIT/ML IJ SOLN
5000.0000 [IU] | Freq: Three times a day (TID) | INTRAMUSCULAR | Status: DC
  Administered 2014-09-21 (×2): 5000 [IU] via SUBCUTANEOUS
  Filled 2014-09-21 (×2): qty 1

## 2014-09-21 MED ORDER — ONDANSETRON HCL 4 MG PO TABS: 4.0000 mg | ORAL_TABLET | Freq: Four times a day (QID) | ORAL | Status: DC | PRN

## 2014-09-21 MED ORDER — HYDROCODONE-ACETAMINOPHEN 5-325 MG PO TABS
1.0000 | ORAL_TABLET | ORAL | Status: DC | PRN
  Filled 2014-09-21: qty 1

## 2014-09-21 NOTE — Care Management Note (Addendum)
Case Management Note  Patient Details  Name: Calvin Hall MRN: [id number] Date of Birth: November 10, 1945  Subjective/Objective:                   Met with patient soon after patient had incontinent stool while ambulating in room. He was not clear who his PCP is but states it is in [facility]- he states he has an appointment coming up soon. He states he lives with his wife. He was ambulating in room independently. He states he uses a cane at home and has a walker but does not use. He denies ever using home health services. He uses [facility] in [place] for Rx.   Action/Plan:  List of home health agencies left at bedside. I contacted [facility] in [place] [phone] to see if patient was established with them. He is not a patient of theirs. I also contacted Dr. Gala Murdoch office at [facility]  [phone] and he was last seen March 2015. Appointment scheduled 9/26/16at [id number].  Expected Discharge Date:                  Expected Discharge Plan:     In-House Referral:     Discharge planning Services  CM Consult  Post Acute Care Choice:    Choice offered to:  Patient  DME Arranged:    DME Agency:     HH Arranged:    [place] Agency:     Status of Service:  In process, will continue to follow  Medicare Important Message Given:    Date Medicare IM Given:    Medicare IM give by:    Date Additional Medicare IM Given:    Additional Medicare Important Message give by:     If discussed at [place] of Stay Meetings, dates discussed:    Additional Comments:  Marshell Garfinkel, RN 09/21/2014, 9:55 AM

## 2014-09-21 NOTE — Discharge Summary (Signed)
[facility] - [place] [facility]   PATIENT NAME: Calvin Hall    MR#:  [id number]  DATE OF BIRTH:  11/27/1945  DATE OF ADMISSION:  09/20/2014 ADMITTING PHYSICIAN: Oralia Manis, MD  DATE OF DISCHARGE: 09/21/14  PRIMARY CARE PHYSICIAN: Evelene Croon, MD    ADMISSION DIAGNOSIS:  Low back pain [M54.5] Leukocytosis [D72.829] Fever [R50.9] Complicated UTI (urinary tract infection) [N39.0] Right-sided low back pain with right-sided sciatica [M54.41] Sepsis, due to unspecified organism [A41.9] Acute renal failure, unspecified acute renal failure type [N17.9]  DISCHARGE DIAGNOSIS:  Sepsis-resolved Acute cystitis Renal insufficiency-appears chronic HTN  SECONDARY DIAGNOSIS:   Past Medical History  Diagnosis Date  . Hypertension   . Depression     HOSPITAL COURSE:  69 y.o. male who presents with leg pain and back pain. Patient is a poor historian, but came in for right leg pain which he says starts up high around the area of his lower lateral back and wraps around to his foot  *Sepsis - resolved -lactic acid normal, hemodynamically stable, does have some mild aki, see below for this. -received IV rocpehin---Change to po keflex Wbc trending down. 22K-->19K, no fever  *Acute cytitis- antibiotics as above, urine culture GNR  * AKI (acute kidney injury) - likely due to dehydration and infection -pt likely has some chronic component- Patient denies any history of chronic renal disease. Avoid nephrotoxins  * HTN (hypertension) - mildly elevated here - continue home antihypertensives   * Depression  - continue home meds  Pt is requesting to go home. He ate well and doing well. CONSULTS OBTAINED:   none  DRUG ALLERGIES:  No Known Allergies  DISCHARGE MEDICATIONS:   Current Discharge Medication List    START taking these medications   Details  cefUROXime (CEFTIN) 500 MG tablet Take 1 tablet (500 mg total) by mouth 2 (two) times  daily with a meal. Qty: 14 tablet, Refills: 0      CONTINUE these medications which have CHANGED   Details  losartan (COZAAR) 100 MG tablet Take 0.5 tablets (50 mg total) by mouth daily. Qty: 30 tablet, Refills: 0      CONTINUE these medications which have NOT CHANGED   Details  haloperidol (HALDOL) 2 MG tablet Take 2 mg by mouth at bedtime.    hydrALAZINE (APRESOLINE) 50 MG tablet Take 50 mg by mouth 2 (two) times daily.    sertraline (ZOLOFT) 50 MG tablet Take 50 mg by mouth at bedtime.    triamterene-hydrochlorothiazide (MAXZIDE) 75-50 MG per tablet Take 1 tablet by mouth daily.        If you experience worsening of your admission symptoms, develop shortness of breath, life threatening emergency, suicidal or homicidal thoughts you must seek medical attention immediately by calling 911 or calling your MD immediately  if symptoms less severe.  You Must read complete instructions/literature along with all the possible adverse reactions/side effects for all the Medicines you take and that have been prescribed to you. Take any new Medicines after you have completely understood and accept all the possible adverse reactions/side effects.   Please note  You were cared for by a hospitalist during your hospital stay. If you have any questions about your discharge medications or the care you received while you were in the hospital after you are discharged, you can call the unit and asked to speak with the hospitalist on call if the hospitalist that took care of you is not available. Once you are discharged, your  primary care physician will handle any further medical issues. Please note that NO REFILLS for any discharge medications will be authorized once you are discharged, as it is imperative that you return to your primary care physician (or establish a relationship with a primary care physician if you do not have one) for your aftercare needs so that they can reassess your need for  medications and monitor your lab values. Today   SUBJECTIVE   Doing well. C/o leg cramps  VITAL SIGNS:  Blood pressure 134/80, pulse 89, temperature 98.2 F (36.8 C), temperature source Oral, resp. rate 18, height 6\' 1"  (1.854 m), weight 115.577 kg (254 lb 12.8 oz), SpO2 96 %.  I/O:   Intake/Output Summary (Last 24 hours) at 09/21/14 1244 Last data filed at 09/21/14 0400  Gross per 24 hour  Intake    480 ml  Output      0 ml  Net    480 ml    PHYSICAL EXAMINATION:  GENERAL:  69 y.o.-year-old patient lying in the bed with no acute distress.  EYES: Pupils equal, round, reactive to light and accommodation. No scleral icterus. Extraocular muscles intact.  HEENT: Head atraumatic, normocephalic. Oropharynx and nasopharynx clear.  NECK:  Supple, no jugular venous distention. No thyroid enlargement, no tenderness.  LUNGS: Normal breath sounds bilaterally, no wheezing, rales,rhonchi or crepitation. No use of accessory muscles of respiration.  CARDIOVASCULAR: S1, S2 normal. No murmurs, rubs, or gallops.  ABDOMEN: Soft, non-tender, non-distended. Bowel sounds present. No organomegaly or mass.  EXTREMITIES: No pedal edema, cyanosis, or clubbing.  NEUROLOGIC: Cranial nerves II through XII are intact. Muscle strength 5/5 in all extremities. Sensation intact. Gait not checked.  PSYCHIATRIC: The patient is alert and oriented x 3.  SKIN: No obvious rash, lesion, or ulcer.   DATA REVIEW:   CBC   Recent Labs Lab 09/21/14 0503  WBC 19.2*  HGB 13.0  HCT 39.0*  PLT 311    Chemistries   Recent Labs Lab 09/21/14 0503  NA 138  K 3.2*  CL 106  CO2 22  GLUCOSE 143*  BUN 41*  CREATININE 2.00*  CALCIUM 8.5*    Microbiology Results   Recent Results (from the past 240 hour(s))  Urine culture     Status: None (Preliminary result)   Collection Time: 09/20/14  1:42 PM  Result Value Ref Range Status   Specimen Description URINE, CLEAN CATCH  Final   Special Requests Normal  Final    Culture   Final    >=100,000 COLONIES/mL GRAM NEGATIVE RODS IDENTIFICATION AND SUSCEPTIBILITIES TO FOLLOW    Report Status PENDING  Incomplete  Blood culture (routine x 2)     Status: None (Preliminary result)   Collection Time: 09/20/14  3:21 PM  Result Value Ref Range Status   Specimen Description BLOOD RIGHT HAND  Final   Special Requests BOTTLES DRAWN AEROBIC AND ANAEROBIC 1 CC  Final   Culture NO GROWTH < 24 HOURS  Final   Report Status PENDING  Incomplete    RADIOLOGY:  Dg Chest 2 View  09/20/2014   CLINICAL DATA:  Low back pain, numbness in right leg starting this morning. Cough, leukocytosis.  EXAM: CHEST  2 VIEW  COMPARISON:  None.  FINDINGS: The heart size and mediastinal contours are within normal limits. Both lungs are clear. The visualized skeletal structures are unremarkable.  IMPRESSION: No active cardiopulmonary disease.   Electronically Signed   By: Charlett Nose M.D.   On: 09/20/2014 14:29  Dg Lumbar Spine Complete  09/20/2014   CLINICAL DATA:  Pt to ed with c/o right leg pain that radiates to low back that started this am when he got up to go use the bathroom, Pt states he felt a "pop" in right leg.  EXAM: LUMBAR SPINE - COMPLETE 4+ VIEW  COMPARISON:  None.  FINDINGS: No fracture. No spondylolisthesis. Mild loss of disc height at L3-L4. There are small endplate osteophytes most evident at L3-L4. Remaining discs and facet joints are relatively well preserved. Soft tissues are unremarkable.  IMPRESSION: No fracture or acute finding.  Mild degenerative changes.   Electronically Signed   By: Amie Portland M.D.   On: 09/20/2014 13:13   Dg Pelvis 1-2 Views  09/20/2014   CLINICAL DATA:  Right leg pain radiating to lower back.  EXAM: PELVIS - 1-2 VIEW  COMPARISON:  None.  FINDINGS: Mild symmetric degenerative changes in the hips bilaterally. SI joints are symmetric and unremarkable. No acute bony abnormality. Specifically, no fracture, subluxation, or dislocation. Soft tissues are  intact.  IMPRESSION: No acute bony abnormality.   Electronically Signed   By: Charlett Nose M.D.   On: 09/20/2014 13:11   Mr Thoracic Spine Wo Contrast  09/20/2014   CLINICAL DATA:  69 year old hypertensive male with low back pain and right leg numbness starting this morning. Right foot numbness yesterday. Initial encounter.  EXAM: MRI THORACIC AND LUMBAR SPINE WITHOUT CONTRAST  TECHNIQUE: Multiplanar and multiecho pulse sequences of the thoracic and lumbar spine were obtained without intravenous contrast.  COMPARISON:  None.  FINDINGS: MR THORACIC SPINE FINDINGS  On the single sagittal sequence obtained for proper level assignment which includes cervical spine, cervical spondylotic changes with baseline narrowed cervical spinal canal is noted but incompletely assessed.  Decreased signal intensity of bone marrow may be related to patient's habitus. Correlation with CBC to exclude anemia contributing to this appearance may be considered  Minimal thoracic degenerative changes without evidence of thoracic disc herniation, significant spinal stenosis or foraminal narrowing. No thoracic cord compression.  Slight ectasia thoracic aorta.  MR LUMBAR SPINE FINDINGS  Exam is motion degraded.  Last fully open disk space is labeled L5-S1. Present examination incorporates from T12-L1 disc space through the S3 level.  Conus L1 level.  Decreased signal intensity of bone marrow may be related to patient's habitus. Correlation with CBC to exclude anemia contributing to this appearance may be considered.  Visualized paravertebral structures unremarkable. Mild dependent subcutaneous edema incidentally noted.  L1-2:  Mild facet bony overgrowth.  L2-3: Mild facet bony overgrowth. Slightly short pedicles. Minimal bulge. Mild spinal stenosis.  L3-4: Facet degenerative changes. Bulge. Short pedicles. Mild to slightly moderate spinal stenosis and bilateral foraminal narrowing.  L4-5: Prominent facet joint degenerative changes. Bulge.  Short pedicles. Ligamentum flavum hypertrophy. Baseline mild spinal stenosis, moderate bilateral lateral recess stenosis and moderate foraminal narrowing greater on the right. Superimposed moderate size right paracentral disc protrusion with significant compression of the right lateral aspect of the thecal sac and origin of the right L5 nerve root.  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 Prominent facet joint degenerative changes greater on the right. Minimal anterior slip L5. Bulge slightly greater to the right. Mild to moderate foraminal narrowing greater on the right. Mild right lateral recess stenosis.  IMPRESSION: MR THORACIC SPINE IMPRESSION  Minimal thoracic degenerative changes without evidence of thoracic disc herniation, significant spinal stenosis or foraminal narrowing. No thoracic cord compression.  Cervical spondylotic changes incompletely assessed on present exam.  MR  LUMBAR SPINE IMPRESSION  Summary of pertinent findings includes:  L4-5 baseline multifactorial mild spinal stenosis, moderate bilateral lateral recess stenosis and moderate foraminal narrowing greater on the right. Superimposed moderate size right paracentral disc protrusion with significant compression of the right lateral aspect of the thecal sac and origin of the right L5 nerve root.  L3-4 multifactorial mild to slightly moderate spinal stenosis and bilateral foraminal narrowing.  L5-S1 multifactorial mild to moderate foraminal narrowing greater on the right. Mild right lateral recess stenosis.  L2-3 multifactorial mild spinal stenosis.  Please note that the exam was performed without contrast after emergency room physician discussed case with another radiologist and it was elected not to give contrast secondary to low GFR (34). This limits evaluation for detection of subtle infection. Taking this limitation into account, no obvious epidural abscess. Fluid/ edema involving the L3-4, L4-5 and L5-S1 facet joints statistically is  related to degenerative changes rather than facet infectious arthropathy. Close follow-up imaging recommended if patient has progressive symptoms suggesting infection.  Decreased signal intensity of bone marrow may be related to patient's habitus. Correlation with CBC to exclude anemia contributing to this appearance may be considered.   Electronically Signed   By: Lacy Duverney M.D.   On: 09/20/2014 19:13   Mr Lumbar Spine Wo Contrast  09/20/2014   CLINICAL DATA:  69 year old hypertensive male with low back pain and right leg numbness starting this morning. Right foot numbness yesterday. Initial encounter.  EXAM: MRI THORACIC AND LUMBAR SPINE WITHOUT CONTRAST  TECHNIQUE: Multiplanar and multiecho pulse sequences of the thoracic and lumbar spine were obtained without intravenous contrast.  COMPARISON:  None.  FINDINGS: MR THORACIC SPINE FINDINGS  On the single sagittal sequence obtained for proper level assignment which includes cervical spine, cervical spondylotic changes with baseline narrowed cervical spinal canal is noted but incompletely assessed.  Decreased signal intensity of bone marrow may be related to patient's habitus. Correlation with CBC to exclude anemia contributing to this appearance may be considered  Minimal thoracic degenerative changes without evidence of thoracic disc herniation, significant spinal stenosis or foraminal narrowing. No thoracic cord compression.  Slight ectasia thoracic aorta.  MR LUMBAR SPINE FINDINGS  Exam is motion degraded.  Last fully open disk space is labeled L5-S1. Present examination incorporates from T12-L1 disc space through the S3 level.  Conus L1 level.  Decreased signal intensity of bone marrow may be related to patient's habitus. Correlation with CBC to exclude anemia contributing to this appearance may be considered.  Visualized paravertebral structures unremarkable. Mild dependent subcutaneous edema incidentally noted.  L1-2:  Mild facet bony overgrowth.   L2-3: Mild facet bony overgrowth. Slightly short pedicles. Minimal bulge. Mild spinal stenosis.  L3-4: Facet degenerative changes. Bulge. Short pedicles. Mild to slightly moderate spinal stenosis and bilateral foraminal narrowing.  L4-5: Prominent facet joint degenerative changes. Bulge. Short pedicles. Ligamentum flavum hypertrophy. Baseline mild spinal stenosis, moderate bilateral lateral recess stenosis and moderate foraminal narrowing greater on the right. Superimposed moderate size right paracentral disc protrusion with significant compression of the right lateral aspect of the thecal sac and origin of the right L5 nerve root.  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 Prominent facet joint degenerative changes greater on the right. Minimal anterior slip L5. Bulge slightly greater to the right. Mild to moderate foraminal narrowing greater on the right. Mild right lateral recess stenosis.  IMPRESSION: MR THORACIC SPINE IMPRESSION  Minimal thoracic degenerative changes without evidence of thoracic disc herniation, significant spinal stenosis or foraminal narrowing. No thoracic  cord compression.  Cervical spondylotic changes incompletely assessed on present exam.  MR LUMBAR SPINE IMPRESSION  Summary of pertinent findings includes:  L4-5 baseline multifactorial mild spinal stenosis, moderate bilateral lateral recess stenosis and moderate foraminal narrowing greater on the right. Superimposed moderate size right paracentral disc protrusion with significant compression of the right lateral aspect of the thecal sac and origin of the right L5 nerve root.  L3-4 multifactorial mild to slightly moderate spinal stenosis and bilateral foraminal narrowing.  L5-S1 multifactorial mild to moderate foraminal narrowing greater on the right. Mild right lateral recess stenosis.  L2-3 multifactorial mild spinal stenosis.  Please note that the exam was performed without contrast after emergency room physician discussed case with  another radiologist and it was elected not to give contrast secondary to low GFR (34). This limits evaluation for detection of subtle infection. Taking this limitation into account, no obvious epidural abscess. Fluid/ edema involving the L3-4, L4-5 and L5-S1 facet joints statistically is related to degenerative changes rather than facet infectious arthropathy. Close follow-up imaging recommended if patient has progressive symptoms suggesting infection.  Decreased signal intensity of bone marrow may be related to patient's habitus. Correlation with CBC to exclude anemia contributing to this appearance may be considered.   Electronically Signed   By: Lacy Duverney M.D.   On: 09/20/2014 19:13     Management plans discussed with the patient, family and they are in agreement.  CODE STATUS:     Code Status Orders        Start     Ordered   09/21/14 0041  Full code   Continuous     09/21/14 0041      TOTAL TIME TAKING CARE OF THIS PATIENT: 40 minutes.    PATEL,SONA M.D on 09/21/2014 at 12:44 PM  Between 7am to 6pm - Pager - [phone] After 6pm go to [email or web address] - password [email or web address] [facility]  [facility]  Office  [phone]  CC: Primary care physician; Evelene Croon, MD

## 2014-09-21 NOTE — Discharge Planning (Signed)
Pt to be discharged home with wife and children, pt does have a UTI RN instructed pt to pick this medication up at the pharmacy, follow up appt made with PCP.

## 2014-09-21 NOTE — Progress Notes (Signed)
ANTIBIOTIC CONSULT NOTE - INITIAL  Pharmacy Consult for Ceftriaxone dosing  Indication: pyelonephritis  No Known Allergies  Patient Measurements: Height:  (185.4 cm) Weight: 278 lb (126.1 kg) IBW/kg (Calculated) : 79.9 Adjusted Body Weight: n/a  Vital Signs: Temp: 101.1 F (38.4 C) (09/20 0108) Temp Source: Oral (09/20 0108) BP: 136/90 mmHg (09/20 0108) Pulse Rate: 92 (09/20 0108) Intake/Output from previous day:   Intake/Output from this shift:    Labs:  Recent Labs  09/20/14 1251  WBC 22.2*  HGB 14.4  PLT 284  CREATININE 2.13*   Estimated Creatinine Clearance: 44.9 mL/min (by C-G formula based on Cr of 2.13). No results for input(s): VANCOTROUGH, VANCOPEAK, VANCORANDOM, GENTTROUGH, GENTPEAK, GENTRANDOM, TOBRATROUGH, TOBRAPEAK, TOBRARND, AMIKACINPEAK, AMIKACINTROU, AMIKACIN in the last 72 hours.   Microbiology: No results found for this or any previous visit (from the past 720 hour(s)).  Medical History: Past Medical History  Diagnosis Date  . Hypertension   . Depression     Medications:   Assessment: UA: LE(+) NO2(+) WBC TNTC Blood and urine cx pending  Goal of Therapy:  Resolve infection  Plan:  Ceftriaxone 2 grams q 24 hours ordered by MD. OK for pt parameters.  McBane,Matthew S 09/21/2014,1:18 AM

## 2014-09-22 LAB — URINE CULTURE
Culture: >=100000
SPECIAL REQUESTS: NORMAL

## 2014-09-27 LAB — CULTURE, BLOOD (ROUTINE X 2): Culture: NO GROWTH

## 2014-11-08 ENCOUNTER — Ambulatory Visit: Payer: Medicare Other | Admitting: Internal Medicine

## 2014-11-17 ENCOUNTER — Ambulatory Visit: Payer: Medicare Other | Admitting: Internal Medicine

## 2014-11-17 DIAGNOSIS — F250 Schizoaffective disorder, bipolar type: Secondary | ICD-10-CM | POA: Diagnosis not present

## 2014-11-24 ENCOUNTER — Encounter: Payer: Self-pay | Admitting: Internal Medicine

## 2014-11-24 ENCOUNTER — Ambulatory Visit (INDEPENDENT_AMBULATORY_CARE_PROVIDER_SITE_OTHER): Payer: Medicare Other | Admitting: Internal Medicine

## 2014-11-24 VITALS — BP 154/106 | HR 86 | Temp 98.2°F | Wt 273.0 lb

## 2014-11-24 DIAGNOSIS — Z23 Encounter for immunization: Secondary | ICD-10-CM | POA: Diagnosis not present

## 2014-11-24 DIAGNOSIS — F32A Depression, unspecified: Secondary | ICD-10-CM

## 2014-11-24 DIAGNOSIS — I10 Essential (primary) hypertension: Secondary | ICD-10-CM | POA: Diagnosis not present

## 2014-11-24 DIAGNOSIS — R7309 Other abnormal glucose: Secondary | ICD-10-CM | POA: Diagnosis not present

## 2014-11-24 DIAGNOSIS — F329 Major depressive disorder, single episode, unspecified: Secondary | ICD-10-CM

## 2014-11-24 DIAGNOSIS — R739 Hyperglycemia, unspecified: Secondary | ICD-10-CM | POA: Diagnosis not present

## 2014-11-24 LAB — CBC
HEMATOCRIT: 46 % (ref 39.0–52.0)
HEMOGLOBIN: 15.5 g/dL (ref 13.0–17.0)
MCH: 28.3 pg (ref 26.0–34.0)
MCHC: 33.7 g/dL (ref 30.0–36.0)
MCV: 84.1 fL (ref 78.0–100.0)
MPV: 11.2 fL (ref 8.6–12.4)
Platelets: 237 10*3/uL (ref 150–400)
RBC: 5.47 MIL/uL (ref 4.22–5.81)
RDW: 14.5 % (ref 11.5–15.5)
WBC: 7.8 10*3/uL (ref 4.0–10.5)

## 2014-11-24 LAB — HEMOGLOBIN A1C
Hgb A1c MFr Bld: 6 % — ABNORMAL HIGH (ref <5.7)
MEAN PLASMA GLUCOSE: 126 mg/dL — AB (ref <117)

## 2014-11-24 NOTE — Progress Notes (Signed)
Pre visit review using our clinic review tool, if applicable. No additional management support is needed unless otherwise documented below in the visit note. 

## 2014-11-24 NOTE — Assessment & Plan Note (Signed)
BP elevated today but he is confused about his meds Advised him to take the Losartan, Hydralazine and Maxide as prescribed CBC, CMET today

## 2014-11-24 NOTE — Assessment & Plan Note (Signed)
Encouraged him to work on diet and exercise Lipid Profile and CMET today

## 2014-11-24 NOTE — Progress Notes (Signed)
HPI   Pt presents to the clinic today to establish care and for management of the conditions listed below. [facility]  HTN: BP elevated today. He is not sure what medications he is taking but has Losartan, Hydralazine and Maxide. He denies chest pain, chest tightness or shortness of breath.  Depression: [place] in [place]. He is not sure what medications he is taking. He has bottles for Cogentin and Zoloft. He denies SI/HI.  Flu: 10/2013 Tetanus: unsure Pneumovax: never Prevnar: never PSA Screening: unsure Colon Screening: never Vision Screening: as needed Dentist: as needed  Past Medical History  Diagnosis Date  . Hypertension   . Depression     Current Outpatient Prescriptions  Medication Sig Dispense Refill  . benztropine (COGENTIN) 0.5 MG tablet Take 1 tablet by mouth at bedtime.    . haloperidol (HALDOL) 2 MG tablet Take 2 mg by mouth at bedtime.    . hydrALAZINE (APRESOLINE) 50 MG tablet Take 50 mg by mouth 2 (two) times daily.    Marland Kitchen. losartan (COZAAR) 100 MG tablet Take 0.5 tablets (50 mg total) by mouth daily. 30 tablet 0  . sertraline (ZOLOFT) 50 MG tablet Take 50 mg by mouth at bedtime.    . triamterene-hydrochlorothiazide (MAXZIDE) 75-50 MG per tablet Take 1 tablet by mouth daily.     No current facility-administered medications for this visit.    No Known Allergies  Family History  Problem Relation Age of Onset  . Lung cancer Father   . Tuberculosis Mother   . Heart failure Sister     Social History   Social History  . Marital Status: Single    Spouse Name: N/A  . Number of Children: N/A  . Years of Education: N/A   Occupational History  . Not on file.   Social History Main Topics  . Smoking status: Former Games developermoker  . Smokeless tobacco: Not on file  . Alcohol Use: No  . Drug Use: No  . Sexual Activity: No   Other Topics Concern  . Not on file   Social History Narrative     ROS:  Constitutional: Denies fever, malaise, fatigue, headache or abrupt weight changes.  HEENT: Denies eye pain, eye redness, ear pain, ringing in the ears, wax buildup, runny nose, nasal congestion, bloody nose, or sore throat. Respiratory: Denies difficulty breathing, shortness of breath, cough or sputum production.   Cardiovascular: Denies chest pain, chest tightness, palpitations or swelling in the hands or feet.  Gastrointestinal: Denies abdominal pain, bloating, constipation, diarrhea or blood in the stool.  GU: Pt reports nocturia. Denies frequency, urgency, pain with urination, blood in urine, odor or discharge. Musculoskeletal: Denies decrease in range of motion, difficulty with gait, muscle pain or joint pain and swelling.  Skin: Denies redness, rashes, lesions or ulcercations.  Neurological: Denies dizziness, difficulty with memory, difficulty with speech or problems with balance and coordination.  Psych: Pt reports depression. Denies anxiety, SI/HI.  No other specific complaints in a complete review of systems (except as listed in HPI above).  PE:  BP 154/106 mmHg  Pulse 86  Temp(Src) 98.2 F (36.8 C) (Oral)  Wt 273 lb (123.832 kg)  SpO2 97% Wt Readings from Last 3 Encounters:  11/24/14 273 lb (123.832 kg)  09/21/14 254 lb 12.8 oz (115.577 kg)  03/16/13 295 lb 5 oz (133.953 kg)    General: Appears his stated age, obese in NAD. HEENT: Head: normal shape and size; Eyes:  sclera white, no icterus, conjunctiva pink, PERRLA and EOMs intact;  Neck: Neck supple, trachea midline. No masses, lumps or thyromegaly present.  Cardiovascular: Normal rate and rhythm. S1,S2 noted.  No murmur, rubs or gallops noted.  Pulmonary/Chest: Normal effort and positive vesicular breath sounds. No respiratory distress. No wheezes, rales or ronchi noted.  Abdomen: Soft and nontender.  Neurological: Alert and oriented. He does seem confused about hi medications. Psychiatric: Affect flat. He  does engage and make eye contact.  BMET    Component Value Date/Time   NA 138 09/21/2014 0503   K 3.2* 09/21/2014 0503   CL 106 09/21/2014 0503   CO2 22 09/21/2014 0503   GLUCOSE 143* 09/21/2014 0503   BUN 41* 09/21/2014 0503   CREATININE 2.00* 09/21/2014 0503   CALCIUM 8.5* 09/21/2014 0503   GFRNONAA 32* 09/21/2014 0503   GFRAA 37* 09/21/2014 0503    Lipid Panel  No results found for: CHOL, TRIG, HDL, CHOLHDL, VLDL, LDLCALC  CBC    Component Value Date/Time   WBC 19.2* 09/21/2014 0503   RBC 4.75 09/21/2014 0503   HGB 13.0 09/21/2014 0503   HCT 39.0* 09/21/2014 0503   PLT 311 09/21/2014 0503   MCV 82.2 09/21/2014 0503   MCH 27.3 09/21/2014 0503   MCHC 33.3 09/21/2014 0503   RDW 13.8 09/21/2014 0503   LYMPHSABS 0.9* 09/20/2014 1251   MONOABS 2.2* 09/20/2014 1251   EOSABS 0.0 09/20/2014 1251   BASOSABS 0.0 09/20/2014 1251    Hgb A1C No results found for: HGBA1C   Assessment and Plan:

## 2014-11-24 NOTE — Assessment & Plan Note (Signed)
He will continue to follow with psych Continue Zoloft and Cogentin CBC, CMET and A1C today

## 2014-11-24 NOTE — Patient Instructions (Signed)

## 2014-11-25 LAB — COMPREHENSIVE METABOLIC PANEL
ALBUMIN: 3.8 g/dL (ref 3.6–5.1)
ALT: 21 U/L (ref 9–46)
AST: 20 U/L (ref 10–35)
Alkaline Phosphatase: 55 U/L (ref 40–115)
BILIRUBIN TOTAL: 0.3 mg/dL (ref 0.2–1.2)
BUN: 27 mg/dL — AB (ref 7–25)
CHLORIDE: 108 mmol/L (ref 98–110)
CO2: 21 mmol/L (ref 20–31)
CREATININE: 1.4 mg/dL — AB (ref 0.70–1.18)
Calcium: 9.8 mg/dL (ref 8.6–10.3)
Glucose, Bld: 93 mg/dL (ref 65–99)
Potassium: 4.3 mmol/L (ref 3.5–5.3)
SODIUM: 141 mmol/L (ref 135–146)
TOTAL PROTEIN: 7.1 g/dL (ref 6.1–8.1)

## 2014-11-25 LAB — LIPID PANEL
CHOLESTEROL: 201 mg/dL — AB (ref 125–200)
HDL: 41 mg/dL (ref >=40)
LDL CALC: 116 mg/dL (ref <130)
TRIGLYCERIDES: 222 mg/dL — AB (ref <150)
Total CHOL/HDL Ratio: 4.9 Ratio (ref <=5.0)
VLDL: 44 mg/dL — AB (ref <30)

## 2014-12-10 LAB — CULTURE, BLOOD (ROUTINE X 2): Culture: NO GROWTH

## 2015-02-08 DIAGNOSIS — F250 Schizoaffective disorder, bipolar type: Secondary | ICD-10-CM | POA: Diagnosis not present

## 2015-02-11 ENCOUNTER — Telehealth: Payer: Self-pay | Admitting: Internal Medicine

## 2015-02-11 NOTE — Telephone Encounter (Signed)
Pt wife Lyla Son called, has some questions about his medications. Pt wife would like call back. Please advise

## 2015-02-14 NOTE — Telephone Encounter (Signed)
Left detailed msg on VM per HIPAA  

## 2015-02-17 NOTE — Telephone Encounter (Signed)
Left detailed msg on VM per HIPAA  

## 2015-05-17 DIAGNOSIS — F250 Schizoaffective disorder, bipolar type: Secondary | ICD-10-CM | POA: Diagnosis not present

## 2015-06-15 ENCOUNTER — Telehealth: Payer: Self-pay

## 2015-06-15 NOTE — Telephone Encounter (Signed)
[facility]; [facility]; did not leave name of med. Last time [facility] filled anything was 09/21/14 # 30 for Dr Allena KatzPatel. I left v/m for pt to call [facility] at only contact #.

## 2015-06-17 NOTE — Telephone Encounter (Signed)
V/M left requesting cb about pt BP med. Left v/m requesting cb; will need to know name of med and pharmacy.

## 2015-06-30 ENCOUNTER — Ambulatory Visit (INDEPENDENT_AMBULATORY_CARE_PROVIDER_SITE_OTHER): Payer: Medicare Other | Admitting: Internal Medicine

## 2015-06-30 ENCOUNTER — Encounter: Payer: Self-pay | Admitting: Internal Medicine

## 2015-06-30 ENCOUNTER — Other Ambulatory Visit: Payer: Self-pay

## 2015-06-30 VITALS — BP 200/120 | HR 94 | Temp 99.0°F | Wt 289.0 lb

## 2015-06-30 DIAGNOSIS — F329 Major depressive disorder, single episode, unspecified: Secondary | ICD-10-CM | POA: Diagnosis not present

## 2015-06-30 DIAGNOSIS — I10 Essential (primary) hypertension: Secondary | ICD-10-CM

## 2015-06-30 DIAGNOSIS — F32A Depression, unspecified: Secondary | ICD-10-CM

## 2015-06-30 MED ORDER — TRIAMTERENE-HCTZ 75-50 MG PO TABS: 1.0000 | ORAL_TABLET | Freq: Every day | ORAL | Status: DC

## 2015-06-30 MED ORDER — LOSARTAN POTASSIUM 100 MG PO TABS: 50.0000 mg | ORAL_TABLET | Freq: Every day | ORAL | Status: DC

## 2015-06-30 NOTE — Progress Notes (Signed)
Pre visit review using our clinic review tool, if applicable. No additional management support is needed unless otherwise documented below in the visit note. 

## 2015-06-30 NOTE — Addendum Note (Signed)
Addended by: Alvina ChouWALSH, TERRI J on: 06/30/2015 04:22 PM   Modules accepted: Kipp BroodSmartSet

## 2015-06-30 NOTE — Patient Instructions (Signed)
Hypertension Hypertension, commonly called high blood pressure, is when the force of blood pumping through your arteries is too strong. Your arteries are the blood vessels that carry blood from your heart throughout your body. A blood pressure reading consists of a higher number over a lower number, such as 110/72. The higher number (systolic) is the pressure inside your arteries when your heart pumps. The lower number (diastolic) is the pressure inside your arteries when your heart relaxes. Ideally you want your blood pressure below 120/80. Hypertension forces your heart to work harder to pump blood. Your arteries may become narrow or stiff. Having untreated or uncontrolled hypertension can cause heart attack, stroke, kidney disease, and other problems. RISK FACTORS Some risk factors for high blood pressure are controllable. Others are not.  Risk factors you cannot control include:   Race. You may be at higher risk if you are African American.  Age. Risk increases with age.  Gender. Men are at higher risk than women before age 45 years. After age 65, women are at higher risk than men. Risk factors you can control include:  Not getting enough exercise or physical activity.  Being overweight.  Getting too much fat, sugar, calories, or salt in your diet.  Drinking too much alcohol. SIGNS AND SYMPTOMS Hypertension does not usually cause signs or symptoms. Extremely high blood pressure (hypertensive crisis) may cause headache, anxiety, shortness of breath, and nosebleed. DIAGNOSIS To check if you have hypertension, your health care provider will measure your blood pressure while you are seated, with your arm held at the level of your heart. It should be measured at least twice using the same arm. Certain conditions can cause a difference in blood pressure between your right and left arms. A blood pressure reading that is higher than normal on one occasion does not mean that you need treatment. If  it is not clear whether you have high blood pressure, you may be asked to return on a different day to have your blood pressure checked again. Or, you may be asked to monitor your blood pressure at home for 1 or more weeks. TREATMENT Treating high blood pressure includes making lifestyle changes and possibly taking medicine. Living a healthy lifestyle can help lower high blood pressure. You may need to change some of your habits. Lifestyle changes may include:  Following the DASH diet. This diet is high in fruits, vegetables, and whole grains. It is low in salt, red meat, and added sugars.  Keep your sodium intake below 2,300 mg per day.  Getting at least 30-45 minutes of aerobic exercise at least 4 times per week.  Losing weight if necessary.  Not smoking.  Limiting alcoholic beverages.  Learning ways to reduce stress. Your health care provider may prescribe medicine if lifestyle changes are not enough to get your blood pressure under control, and if one of the following is true:  You are 18-59 years of age and your systolic blood pressure is above 140.  You are 60 years of age or older, and your systolic blood pressure is above 150.  Your diastolic blood pressure is above 90.  You have diabetes, and your systolic blood pressure is over 140 or your diastolic blood pressure is over 90.  You have kidney disease and your blood pressure is above 140/90.  You have heart disease and your blood pressure is above 140/90. Your personal target blood pressure may vary depending on your medical conditions, your age, and other factors. HOME CARE INSTRUCTIONS    Have your blood pressure rechecked as directed by your health care provider.   Take medicines only as directed by your health care provider. Follow the directions carefully. Blood pressure medicines must be taken as prescribed. The medicine does not work as well when you skip doses. Skipping doses also puts you at risk for  problems.  Do not smoke.   Monitor your blood pressure at home as directed by your health care provider. SEEK MEDICAL CARE IF:   You think you are having a reaction to medicines taken.  You have recurrent headaches or feel dizzy.  You have swelling in your ankles.  You have trouble with your vision. SEEK IMMEDIATE MEDICAL CARE IF:  You develop a severe headache or confusion.  You have unusual weakness, numbness, or feel faint.  You have severe chest or abdominal pain.  You vomit repeatedly.  You have trouble breathing. MAKE SURE YOU:   Understand these instructions.  Will watch your condition.  Will get help right away if you are not doing well or get worse.   This information is not intended to replace advice given to you by your health care provider. Make sure you discuss any questions you have with your health care provider.   Document Released: 12/18/2004 Document Revised: 05/04/2014 Document Reviewed: 10/10/2012 Elsevier Interactive Patient Education 2016 Elsevier Inc.  

## 2015-06-30 NOTE — Assessment & Plan Note (Signed)
Encouraged him to work on diet and exercise 

## 2015-06-30 NOTE — Assessment & Plan Note (Signed)
Uncontrolled due to medication noncompliance Medications refilled today RTC in 3 weeks for BP check CMET today

## 2015-06-30 NOTE — Progress Notes (Signed)
HPI   Pt presents to the clinic today for follow up of chronic conditions.  HTN: BP elevated today at 200/120. He is prescribed Losartan, Hydralazine and Maxide but reports he is only taking the Hydralazine. He ran out of the other 2 but never called for a refill. He denies chest pain, chest tightness or shortness of breath. There is no ECG on file.  Depression: [place]. He is not sure what medications he is taking. He has bottles for Cogentin, Haldol and Zoloft. He denies SI/HI.   Past Medical History  Diagnosis Date  . Hypertension   . Depression     Current Outpatient Prescriptions  Medication Sig Dispense Refill  . benztropine (COGENTIN) 0.5 MG tablet Take 1 tablet by mouth at bedtime.    . haloperidol (HALDOL) 1 MG tablet Take 1 tablet by mouth at bedtime.    . haloperidol (HALDOL) 2 MG tablet Take 2 mg by mouth at bedtime.    . hydrALAZINE (APRESOLINE) 50 MG tablet Take 50 mg by mouth 2 (two) times daily.    Marland Kitchen. losartan (COZAAR) 100 MG tablet Take 0.5 tablets (50 mg total) by mouth daily. 30 tablet 0  . sertraline (ZOLOFT) 50 MG tablet Take 50 mg by mouth at bedtime.    . triamterene-hydrochlorothiazide (MAXZIDE) 75-50 MG per tablet Take 1 tablet by mouth daily.     No current facility-administered medications for this visit.    No Known Allergies  Family History  Problem Relation Age of Onset  . Lung cancer Father   . Tuberculosis Mother   . Heart failure Sister     Social History   Social History  . Marital Status: Single    Spouse Name: N/A  . Number of Children: N/A  . Years of Education: N/A   Occupational History  . Not on file.   Social History Main Topics  . Smoking status: Former Games developermoker  . Smokeless tobacco: Not on file  . Alcohol Use: No  . Drug Use: No  . Sexual Activity: No   Other Topics Concern  . Not on file   Social History Narrative    ROS:  Constitutional: Denies fever, malaise, fatigue,  headache or abrupt weight changes.   Respiratory: Denies difficulty breathing, shortness of breath, cough or sputum production.   Cardiovascular: Denies chest pain, chest tightness, palpitations or swelling in the hands or feet.  Neurological: Denies dizziness, difficulty with memory, difficulty with speech or problems with balance and coordination.  Psych: Pt reports depression. Denies anxiety, SI/HI.  No other specific complaints in a complete review of systems (except as listed in HPI above).  PE:  BP 200/120 mmHg  Pulse 94  Temp(Src) 99 F (37.2 C) (Oral)  Wt 289 lb (131.09 kg)  SpO2 96%  Wt Readings from Last 3 Encounters:  11/24/14 273 lb (123.832 kg)  09/21/14 254 lb 12.8 oz (115.577 kg)  03/16/13 295 lb 5 oz (133.953 kg)    General: Appears his stated age, obese in NAD. Cardiovascular: Normal rate and rhythm. S1,S2 noted.  No murmur, rubs or gallops noted.  Pulmonary/Chest: Normal effort and positive vesicular breath sounds. No respiratory distress. No wheezes, rales or ronchi noted.   Neurological: Alert and oriented. He does seem confused about his medications. Psychiatric: Affect flat. He does engage and make eye contact.  BMET    Component Value Date/Time   NA 141 11/24/2014 1608   K 4.3 11/24/2014 1608   CL 108  11/24/2014 1608   CO2 21 11/24/2014 1608   GLUCOSE 93 11/24/2014 1608   BUN 27* 11/24/2014 1608   CREATININE 1.40* 11/24/2014 1608   CREATININE 2.00* 09/21/2014 0503   CALCIUM 9.8 11/24/2014 1608   GFRNONAA 32* 09/21/2014 0503   GFRAA 37* 09/21/2014 0503    Lipid Panel     Component Value Date/Time   CHOL 201* 11/24/2014 1608   TRIG 222* 11/24/2014 1608   HDL 41 11/24/2014 1608   CHOLHDL 4.9 11/24/2014 1608   VLDL 44* 11/24/2014 1608   LDLCALC 116 11/24/2014 1608    CBC    Component Value Date/Time   WBC 7.8 11/24/2014 1608   RBC 5.47 11/24/2014 1608   HGB 15.5 11/24/2014 1608   HCT 46.0 11/24/2014 1608   PLT 237 11/24/2014 1608    MCV 84.1 11/24/2014 1608   MCH 28.3 11/24/2014 1608   MCHC 33.7 11/24/2014 1608   RDW 14.5 11/24/2014 1608   LYMPHSABS 0.9* 09/20/2014 1251   MONOABS 2.2* 09/20/2014 1251   EOSABS 0.0 09/20/2014 1251   BASOSABS 0.0 09/20/2014 1251    Hgb A1C Lab Results  Component Value Date   HGBA1C 6.0* 11/24/2014     Assessment and Plan:

## 2015-06-30 NOTE — Assessment & Plan Note (Signed)
Controlled on current therapy He will continue to follow with psychiatry

## 2015-07-01 LAB — BASIC METABOLIC PANEL
BUN: 21 mg/dL (ref 6–23)
CHLORIDE: 104 meq/L (ref 96–112)
CO2: 30 meq/L (ref 19–32)
CREATININE: 1.56 mg/dL — AB (ref 0.40–1.50)
Calcium: 9.8 mg/dL (ref 8.4–10.5)
GFR: 56.74 mL/min — ABNORMAL LOW (ref >60.00)
Glucose, Bld: 112 mg/dL — ABNORMAL HIGH (ref 70–99)
Potassium: 4 mEq/L (ref 3.5–5.1)
Sodium: 141 mEq/L (ref 135–145)

## 2015-07-21 ENCOUNTER — Ambulatory Visit: Payer: Medicare Other | Admitting: Internal Medicine

## 2015-07-22 ENCOUNTER — Encounter: Payer: Self-pay | Admitting: Internal Medicine

## 2015-07-22 ENCOUNTER — Ambulatory Visit (INDEPENDENT_AMBULATORY_CARE_PROVIDER_SITE_OTHER): Payer: Medicare Other | Admitting: Internal Medicine

## 2015-07-22 VITALS — BP 210/110 | HR 92 | Temp 98.4°F | Wt 293.0 lb

## 2015-07-22 DIAGNOSIS — I10 Essential (primary) hypertension: Secondary | ICD-10-CM

## 2015-07-22 NOTE — Progress Notes (Signed)
Pre visit review using our clinic review tool, if applicable. No additional management support is needed unless otherwise documented below in the visit note. 

## 2015-07-22 NOTE — Patient Instructions (Signed)
Hypertension Hypertension, commonly called high blood pressure, is when the force of blood pumping through your arteries is too strong. Your arteries are the blood vessels that carry blood from your heart throughout your body. A blood pressure reading consists of a higher number over a lower number, such as 110/72. The higher number (systolic) is the pressure inside your arteries when your heart pumps. The lower number (diastolic) is the pressure inside your arteries when your heart relaxes. Ideally you want your blood pressure below 120/80. Hypertension forces your heart to work harder to pump blood. Your arteries may become narrow or stiff. Having untreated or uncontrolled hypertension can cause heart attack, stroke, kidney disease, and other problems. RISK FACTORS Some risk factors for high blood pressure are controllable. Others are not.  Risk factors you cannot control include:   Race. You may be at higher risk if you are African American.  Age. Risk increases with age.  Gender. Men are at higher risk than women before age 45 years. After age 65, women are at higher risk than men. Risk factors you can control include:  Not getting enough exercise or physical activity.  Being overweight.  Getting too much fat, sugar, calories, or salt in your diet.  Drinking too much alcohol. SIGNS AND SYMPTOMS Hypertension does not usually cause signs or symptoms. Extremely high blood pressure (hypertensive crisis) may cause headache, anxiety, shortness of breath, and nosebleed. DIAGNOSIS To check if you have hypertension, your health care provider will measure your blood pressure while you are seated, with your arm held at the level of your heart. It should be measured at least twice using the same arm. Certain conditions can cause a difference in blood pressure between your right and left arms. A blood pressure reading that is higher than normal on one occasion does not mean that you need treatment. If  it is not clear whether you have high blood pressure, you may be asked to return on a different day to have your blood pressure checked again. Or, you may be asked to monitor your blood pressure at home for 1 or more weeks. TREATMENT Treating high blood pressure includes making lifestyle changes and possibly taking medicine. Living a healthy lifestyle can help lower high blood pressure. You may need to change some of your habits. Lifestyle changes may include:  Following the DASH diet. This diet is high in fruits, vegetables, and whole grains. It is low in salt, red meat, and added sugars.  Keep your sodium intake below 2,300 mg per day.  Getting at least 30-45 minutes of aerobic exercise at least 4 times per week.  Losing weight if necessary.  Not smoking.  Limiting alcoholic beverages.  Learning ways to reduce stress. Your health care provider may prescribe medicine if lifestyle changes are not enough to get your blood pressure under control, and if one of the following is true:  You are 18-59 years of age and your systolic blood pressure is above 140.  You are 60 years of age or older, and your systolic blood pressure is above 150.  Your diastolic blood pressure is above 90.  You have diabetes, and your systolic blood pressure is over 140 or your diastolic blood pressure is over 90.  You have kidney disease and your blood pressure is above 140/90.  You have heart disease and your blood pressure is above 140/90. Your personal target blood pressure may vary depending on your medical conditions, your age, and other factors. HOME CARE INSTRUCTIONS    Have your blood pressure rechecked as directed by your health care provider.   Take medicines only as directed by your health care provider. Follow the directions carefully. Blood pressure medicines must be taken as prescribed. The medicine does not work as well when you skip doses. Skipping doses also puts you at risk for  problems.  Do not smoke.   Monitor your blood pressure at home as directed by your health care provider. SEEK MEDICAL CARE IF:   You think you are having a reaction to medicines taken.  You have recurrent headaches or feel dizzy.  You have swelling in your ankles.  You have trouble with your vision. SEEK IMMEDIATE MEDICAL CARE IF:  You develop a severe headache or confusion.  You have unusual weakness, numbness, or feel faint.  You have severe chest or abdominal pain.  You vomit repeatedly.  You have trouble breathing. MAKE SURE YOU:   Understand these instructions.  Will watch your condition.  Will get help right away if you are not doing well or get worse.   This information is not intended to replace advice given to you by your health care provider. Make sure you discuss any questions you have with your health care provider.   Document Released: 12/18/2004 Document Revised: 05/04/2014 Document Reviewed: 10/10/2012 Elsevier Interactive Patient Education 2016 Elsevier Inc.  

## 2015-07-22 NOTE — Progress Notes (Signed)
Subjective:    Patient ID: Calvin Hall, male    DOB: 02/11/1945, 70 y.o.   MRN: [id number]  HPI  Pt presents to the clinic today for BP check. At his last visit, his BP was 200/120. He advised me at that time that he was only taking the Hydralazine. He had run out of his Maxide and Losartan and never requested a refill. His medications were refilled at that time and he was advised to take all 3 medications. He reports he is still only taking Hydralazine once daily. His BP today is 200/110.Marland Kitchen. There is no ECG on file. Creatinine is 1.56.  Review of Systems      Past Medical History  Diagnosis Date  . Hypertension   . Depression     Current Outpatient Prescriptions  Medication Sig Dispense Refill  . benztropine (COGENTIN) 0.5 MG tablet Take 1 tablet by mouth at bedtime.    . haloperidol (HALDOL) 1 MG tablet Take 1 tablet by mouth at bedtime.    . haloperidol (HALDOL) 2 MG tablet Take 2 mg by mouth at bedtime.    . hydrALAZINE (APRESOLINE) 50 MG tablet Take 50 mg by mouth 2 (two) times daily. Reported on 06/30/2015    . losartan (COZAAR) 100 MG tablet Take 0.5 tablets (50 mg total) by mouth daily. 30 tablet 5  . sertraline (ZOLOFT) 50 MG tablet Take 50 mg by mouth at bedtime.    . triamterene-hydrochlorothiazide (MAXZIDE) 75-50 MG tablet Take 1 tablet by mouth daily. Reported on 06/30/2015 30 tablet 5   No current facility-administered medications for this visit.    No Known Allergies  Family History  Problem Relation Age of Onset  . Lung cancer Father   . Tuberculosis Mother   . Heart failure Sister     Social History   Social History  . Marital Status: Single    Spouse Name: N/A  . Number of Children: N/A  . Years of Education: N/A   Occupational History  . Not on file.   Social History Main Topics  . Smoking status: Former Games developermoker  . Smokeless tobacco: Not on file  . Alcohol Use: No  . Drug Use: No  . Sexual Activity: No   Other Topics Concern  . Not on  file   Social History Narrative     Constitutional: Denies fever, malaise, fatigue, headache or abrupt weight changes.  Respiratory: Denies difficulty breathing, shortness of breath, cough or sputum production.   Cardiovascular: Denies chest pain, chest tightness, palpitations or swelling in the hands or feet.     No other specific complaints in a complete review of systems (except as listed in HPI above).  Objective:   Physical Exam  BP 210/110 mmHg  Pulse 92  Temp(Src) 98.4 F (36.9 C) (Oral)  Wt 293 lb (132.904 kg)  SpO2 96% Wt Readings from Last 3 Encounters:  07/22/15 293 lb (132.904 kg)  06/30/15 289 lb (131.09 kg)  11/24/14 273 lb (123.832 kg)    General: Appears his stated age, obese in NAD. Cardiovascular: Normal rate and rhythm. S1,S2 noted.  No murmur, rubs or gallops noted. Pulmonary/Chest: Normal effort and positive vesicular breath sounds. No respiratory distress. No wheezes, rales or ronchi noted.  Neurological: Alert and oriented. Thought content delayed.   BMET    Component Value Date/Time   NA 141 06/30/2015 1622   K 4.0 06/30/2015 1622   CL 104 06/30/2015 1622   CO2 30 06/30/2015 1622   GLUCOSE 112* 06/30/2015  1622   BUN 21 06/30/2015 1622   CREATININE 1.56* 06/30/2015 1622   CREATININE 1.40* 11/24/2014 1608   CALCIUM 9.8 06/30/2015 1622   GFRNONAA 32* 09/21/2014 0503   GFRAA 37* 09/21/2014 0503    Lipid Panel     Component Value Date/Time   CHOL 201* 11/24/2014 1608   TRIG 222* 11/24/2014 1608   HDL 41 11/24/2014 1608   CHOLHDL 4.9 11/24/2014 1608   VLDL 44* 11/24/2014 1608   LDLCALC 116 11/24/2014 1608    CBC    Component Value Date/Time   WBC 7.8 11/24/2014 1608   RBC 5.47 11/24/2014 1608   HGB 15.5 11/24/2014 1608   HCT 46.0 11/24/2014 1608   PLT 237 11/24/2014 1608   MCV 84.1 11/24/2014 1608   MCH 28.3 11/24/2014 1608   MCHC 33.7 11/24/2014 1608   RDW 14.5 11/24/2014 1608   LYMPHSABS 0.9* 09/20/2014 1251   MONOABS 2.2*  09/20/2014 1251   EOSABS 0.0 09/20/2014 1251   BASOSABS 0.0 09/20/2014 1251    Hgb A1C Lab Results  Component Value Date   HGBA1C 6.0* 11/24/2014         Assessment & Plan:   HTN:  Uncontrolled I think he has trouble understanding his medications and how to take them Discussed with his daughter Tacey Ruiz about making him a pill box and making sure he takes his medications Thorough med review with pt and daughter  RTC in 1 month to recheck BP

## 2015-08-09 DIAGNOSIS — F250 Schizoaffective disorder, bipolar type: Secondary | ICD-10-CM | POA: Diagnosis not present

## 2015-09-26 ENCOUNTER — Ambulatory Visit: Payer: Medicare Other | Admitting: Internal Medicine

## 2015-11-02 NOTE — Telephone Encounter (Signed)
Pt has not cb but was seen in office 06/30/15.

## 2015-11-09 DIAGNOSIS — F250 Schizoaffective disorder, bipolar type: Secondary | ICD-10-CM | POA: Diagnosis not present

## 2016-01-30 ENCOUNTER — Telehealth: Payer: Self-pay | Admitting: Internal Medicine

## 2016-01-30 DIAGNOSIS — F250 Schizoaffective disorder, bipolar type: Secondary | ICD-10-CM | POA: Diagnosis not present

## 2016-01-30 NOTE — Telephone Encounter (Signed)
[facility] - Client TELEPHONE ADVICE RECORD [facility] Patient Name: Calvin RuffiniATHAN RICHARDSO N DOB: March 21, 1945 Initial Comment Caller states c/o elevated blood pressure 159/92, heart rate 99. Nurse Assessment Nurse: Lane HackerHarley, RN, Elvin SoWindy Date/Time Lamount Cohen(Eastern Time): 01/30/2016 2:14:42 PM Confirm and document reason for call. If symptomatic, describe symptoms. ---Caller states c/o elevated blood pressure 159/92, heart rate 99 at doctor's office. Denies CP, SOB. Does the patient have any new or worsening symptoms? ---Yes Will a triage be completed? ---Yes Related visit to physician within the last 2 weeks? ---Yes Does the PT have any chronic conditions? (i.e. diabetes, asthma, etc.) ---Yes List chronic conditions. ---DM, HTN Is this a behavioral health or substance abuse call? ---No Guidelines Guideline Title Affirmed Question Affirmed Notes High Blood Pressure [1] BP # 140/90 AND [2] taking BP medications Final Disposition User See PCP within 2 Mikey CollegeWeeks Harley, RN, Elvin SoWindy Comments Caller did not want to make appt at this time. Referrals REFERRED TO PCP OFFICE Disagree/Comply: Comply

## 2016-01-30 NOTE — Telephone Encounter (Signed)
Did he make appt?

## 2016-01-31 NOTE — Telephone Encounter (Signed)
Called pt to schedule appt, mid conversation he told me had to go and will call back---then hung up phone

## 2016-02-03 NOTE — Telephone Encounter (Signed)
Pt stated that he does not know when he can come in because he has to get a ride

## 2016-04-23 ENCOUNTER — Other Ambulatory Visit: Payer: Self-pay | Admitting: Internal Medicine

## 2016-04-23 DIAGNOSIS — I10 Essential (primary) hypertension: Secondary | ICD-10-CM

## 2016-04-24 DIAGNOSIS — F250 Schizoaffective disorder, bipolar type: Secondary | ICD-10-CM | POA: Diagnosis not present

## 2016-06-21 ENCOUNTER — Ambulatory Visit (INDEPENDENT_AMBULATORY_CARE_PROVIDER_SITE_OTHER): Payer: Medicare Other | Admitting: Family Medicine

## 2016-06-21 ENCOUNTER — Encounter: Payer: Self-pay | Admitting: Family Medicine

## 2016-06-21 ENCOUNTER — Other Ambulatory Visit: Payer: Self-pay | Admitting: Internal Medicine

## 2016-06-21 ENCOUNTER — Ambulatory Visit (INDEPENDENT_AMBULATORY_CARE_PROVIDER_SITE_OTHER)
Admission: RE | Admit: 2016-06-21 | Discharge: 2016-06-21 | Disposition: A | Discharge status: Home / Self Care | Payer: Medicare Other | Source: Ambulatory Visit | Attending: Family Medicine | Admitting: Family Medicine

## 2016-06-21 VITALS — BP 156/100 | HR 108 | Temp 97.7°F | Ht 71.5 in | Wt 249.5 lb

## 2016-06-21 DIAGNOSIS — M545 Low back pain: Secondary | ICD-10-CM | POA: Diagnosis not present

## 2016-06-21 DIAGNOSIS — M5416 Radiculopathy, lumbar region: Secondary | ICD-10-CM | POA: Diagnosis not present

## 2016-06-21 DIAGNOSIS — S32040A Wedge compression fracture of fourth lumbar vertebra, initial encounter for closed fracture: Secondary | ICD-10-CM | POA: Diagnosis not present

## 2016-06-21 MED ORDER — TIZANIDINE HCL 4 MG PO TABS: 4.0000 mg | ORAL_TABLET | Freq: Every day | ORAL | 1 refills | Status: AC

## 2016-06-21 MED ORDER — TIZANIDINE HCL 4 MG PO TABS: 4.0000 mg | ORAL_TABLET | Freq: Every day | ORAL | 1 refills | Status: DC

## 2016-06-21 MED ORDER — TRAMADOL HCL 50 MG PO TABS: 50.0000 mg | ORAL_TABLET | Freq: Four times a day (QID) | ORAL | 1 refills | Status: AC | PRN

## 2016-06-21 NOTE — Progress Notes (Signed)
Dr. Karleen Hampshire T. Meshell Abdulaziz, MD, CAQ [facility] [address] [place], [address] Phone: [phone] Fax: [phone]  06/21/2016  Patient: Calvin Hall, MRN: [id number], DOB: [birth year], 71 y.o.  Primary Physician:  Lorre Munroe, NP   Chief Complaint  Patient presents with  . Leg Pain    Left leg pop/pain in right leg  . Back Pain   Subjective:   Calvin Hall is a 71 y.o. very pleasant male patient who presents with the following: Back Pain  Pleasant gentleman who after discussion with him and his wife along with noticing that he takes 3 milligrams of Haldol at night along with Cogentin, medical history significant for affective disorder such as schizophrenia.  He is a poor historian. His wife provides additional history.  ongoing for approximately: 1 week The patient has had back pain before. The back pain is localized into the lumbar spine area and R buttocks. They also describe R radiculopathy.  Back pain and pain in the buttocks area. Started from last Sunday.   Push mower and tangled up in the roots.  Couldn't go to church.   Trauma sciatica  No numbness or tingling. No bowel or bladder incontinence. No focal weakness. Prior interventions: none Physical therapy: No Chiropractic manipulations: No Acupuncture: No Osteopathic manipulation: No Heat or cold: Minimal effect  Past Medical History, Surgical History, Family History, Medications, Allergies have been reviewed and updated if relevant.  Patient Active Problem List   Diagnosis Date Noted  . Severe obesity (BMI >= 40) (HCC) 11/24/2014  . HTN (hypertension) 09/20/2014  . Depression 09/20/2014    Past Medical History:  Diagnosis Date  . Depression   . Hypertension     Past Surgical History:  Procedure Laterality Date  . NO PAST SURGERIES      Social History   Social History  . Marital status: Single    Spouse name: N/A  . Number of  children: N/A  . Years of education: N/A   Occupational History  . Not on file.   Social History Main Topics  . Smoking status: Former Games developer  . Smokeless tobacco: Never Used  . Alcohol use No  . Drug use: No  . Sexual activity: No   Other Topics Concern  . Not on file   Social History Narrative  . No narrative on file    Family History  Problem Relation Age of Onset  . Lung cancer Father   . Tuberculosis Mother   . Heart failure Sister     No Known Allergies  Medication list reviewed and updated in full in  Link.  GEN: No fevers, chills. Nontoxic. Primarily MSK c/o today. MSK: Detailed in the HPI GI: tolerating PO intake without difficulty Neuro: As above  Otherwise the pertinent positives of the ROS are noted above.    Objective:   Blood pressure (!) 156/100, pulse (!) 108, temperature 97.7 F (36.5 C), temperature source Oral, height 5' 11.5" (1.816 m), weight 249 lb 8 oz (113.2 kg).  Gen: Well-developed,well-nourished,in no acute distress; alert,appropriate and cooperative throughout examination HEENT: Normocephalic and atraumatic without obvious abnormalities.  Ears, externally no deformities Pulm: Breathing comfortably in no respiratory distress Range of motion at  the waist: Flexion, rotation and lateral bending: Relatively preserved with some pain and limited in extension as well as flexion to approximately 90. Lateral motions and rotation are preserved.   No echymosis or edema Rises to examination table with no difficulty  Gait: minimally antalgic  Inspection/Deformity: No abnormality Paraspinus T:  Pain primarily in the right side from L 4 through S1 and in the buttocks region.  B Ankle Dorsiflexion (L5,4): 5/5 B Great Toe Dorsiflexion (L5,4): 5/5 Heel Walk (L5): WNL Toe Walk (S1): WNL Rise/Squat (L4): WNL, mild pain  SENSORY B Medial Foot (L4): WNL B Dorsum (L5): WNL B Lateral (S1): WNL Light Touch: WNL Pinprick:  WNL  REFLEXES Knee (L4): 2+ Ankle (S1): 2+  B SLR, seated: neg B SLR, supine: neg B FABER: neg B Reverse FABER: neg B Greater Troch: NT B Log Roll: neg B Stork: NT B Sciatic Notch: NT  Radiology: Dg Lumbar Spine Complete  Result Date: 06/21/2016 CLINICAL DATA:  Fall.  Back pain. EXAM: LUMBAR SPINE - COMPLETE 4+ VIEW COMPARISON:  No prior . FINDINGS: Diffuse multilevel degenerative change. Mild L4 compression fracture, age undetermined. Aortoiliac atherosclerotic vascular calcification. IMPRESSION: 1.  Mild L4 compression fracture, age undetermined. 2. Diffuse degenerative change. 3. Aortoiliac atherosclerotic vascular disease. Electronically Signed   By: Maisie Fushomas  Register   On: 06/21/2016 12:33    Assessment and Plan:   Right lumbar radiculopathy - Plan: DG Lumbar Spine Complete  Closed compression fracture of fourth lumbar vertebra, initial encounter [facility](HCC)   Given location and compression fracture, cannot exclude that this is acute and may be involved and current symptoms. Likely this will resolve within 4-6 weeks on its own.  Conservative care with mild pain medicine and Zanaflex right now.  Avoid steroids.  Follow-up: prn only  Meds ordered this encounter  Medications  . DISCONTD: tiZANidine (ZANAFLEX) 4 MG tablet    Sig: Take 1 tablet (4 mg total) by mouth at bedtime.    Dispense:  30 tablet    Refill:  1  . traMADol (ULTRAM) 50 MG tablet    Sig: Take 1 tablet (50 mg total) by mouth every 6 (six) hours as needed.    Dispense:  50 tablet    Refill:  1  . tiZANidine (ZANAFLEX) 4 MG tablet    Sig: Take 1 tablet (4 mg total) by mouth at bedtime.    Dispense:  30 tablet    Refill:  1   Medications Discontinued During This Encounter  Medication Reason  . tiZANidine (ZANAFLEX) 4 MG tablet Reorder   Orders Placed This Encounter  Procedures  . DG Lumbar Spine Complete    Signed,  Pollyanna Levay T. Deray Dawes, MD   Allergies as of 06/21/2016   No Known Allergies      Medication List       Accurate as of 06/21/16  1:31 PM. Always use your most recent med list.          benztropine 0.5 MG tablet Commonly known as:  COGENTIN Take 1 tablet by mouth at bedtime.   haloperidol 2 MG tablet Commonly known as:  HALDOL Take 2 mg by mouth at bedtime.   haloperidol 1 MG tablet Commonly known as:  HALDOL Take 1 tablet by mouth at bedtime.   hydrALAZINE 50 MG tablet Commonly known as:  APRESOLINE Take 50 mg by mouth 2 (two) times daily. Reported on 06/30/2015   losartan 100 MG tablet Commonly known as:  COZAAR Take 0.5 tablets (50 mg total) by mouth daily.   sertraline 50 MG tablet Commonly known as:  ZOLOFT Take 50 mg by mouth at bedtime.   tiZANidine 4 MG tablet Commonly known as:  ZANAFLEX Take 1 tablet (4 mg total) by mouth at bedtime.  traMADol 50 MG tablet Commonly known as:  ULTRAM Take 1 tablet (50 mg total) by mouth every 6 (six) hours as needed.   triamterene-hydrochlorothiazide 75-50 MG tablet Commonly known as:  MAXZIDE TAKE 1 TABLET BY MOUTH DAILY FOR FLUID/BLOOD PRESSURE

## 2016-06-22 ENCOUNTER — Telehealth: Payer: Self-pay | Admitting: Family Medicine

## 2016-06-22 ENCOUNTER — Encounter: Payer: Self-pay | Admitting: *Deleted

## 2016-06-22 NOTE — Telephone Encounter (Signed)
Spoke to [place] (wife) DPR.  Carrie asked to have the results sent by letter.  Letter mailed.

## 2016-06-22 NOTE — Telephone Encounter (Signed)
Calvin Hall called asking about results from xray. Advised [name] but she is still requesting a call for results.

## 2016-06-26 ENCOUNTER — Telehealth: Payer: Self-pay

## 2016-06-26 NOTE — Telephone Encounter (Signed)
[facility] wants to know if pt can get back brace due to back pain. Pt was seen 06/21/16. Carrie request cb.

## 2016-06-26 NOTE — Telephone Encounter (Signed)
Very simple back brace like you could get at [facility], [facility], or [facility] would not be unreasonable.  These are not covered from an insurance standpoint, but could be bought over-the-counter for approximately 20 dollars or so.

## 2016-06-26 NOTE — Telephone Encounter (Signed)
Patient's wife notified as instructed by telephone and verbalized understanding. 

## 2016-07-05 ENCOUNTER — Other Ambulatory Visit: Payer: Self-pay | Admitting: Internal Medicine

## 2016-07-05 DIAGNOSIS — I10 Essential (primary) hypertension: Secondary | ICD-10-CM

## 2016-07-16 DIAGNOSIS — F250 Schizoaffective disorder, bipolar type: Secondary | ICD-10-CM | POA: Diagnosis not present

## 2016-09-20 IMAGING — CR DG CHEST 2V
1 series · 2 of 2 positions shown · non-contrast
Comparison: None.

CLINICAL DATA: Low back pain, numbness in right leg starting this
morning. Cough, leukocytosis.

EXAM:
CHEST  2 VIEW

[Series 1: dg chest 2 view · 0.14mm/px · 2 of 2 slices shown]
[im 1/2]
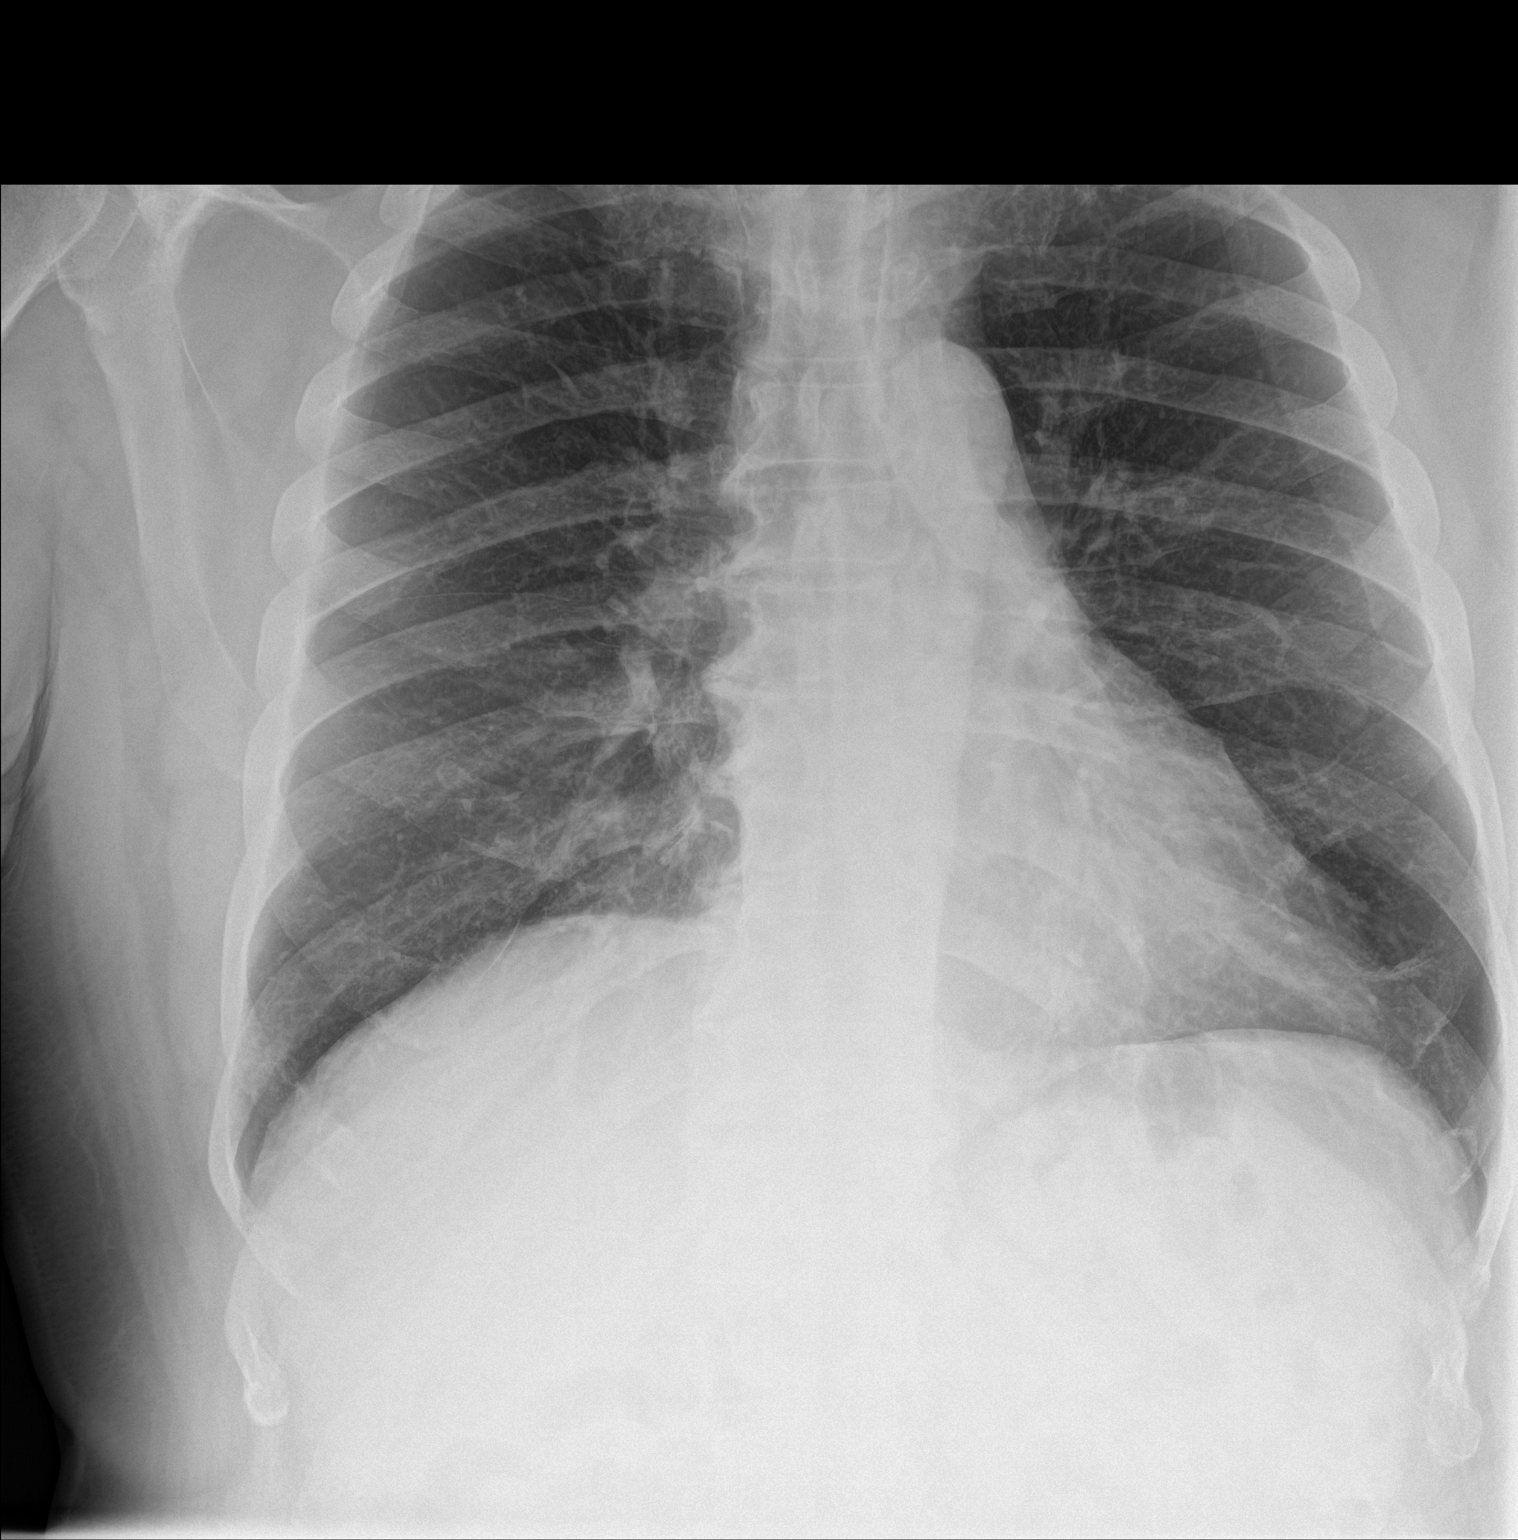
[im 2/2]
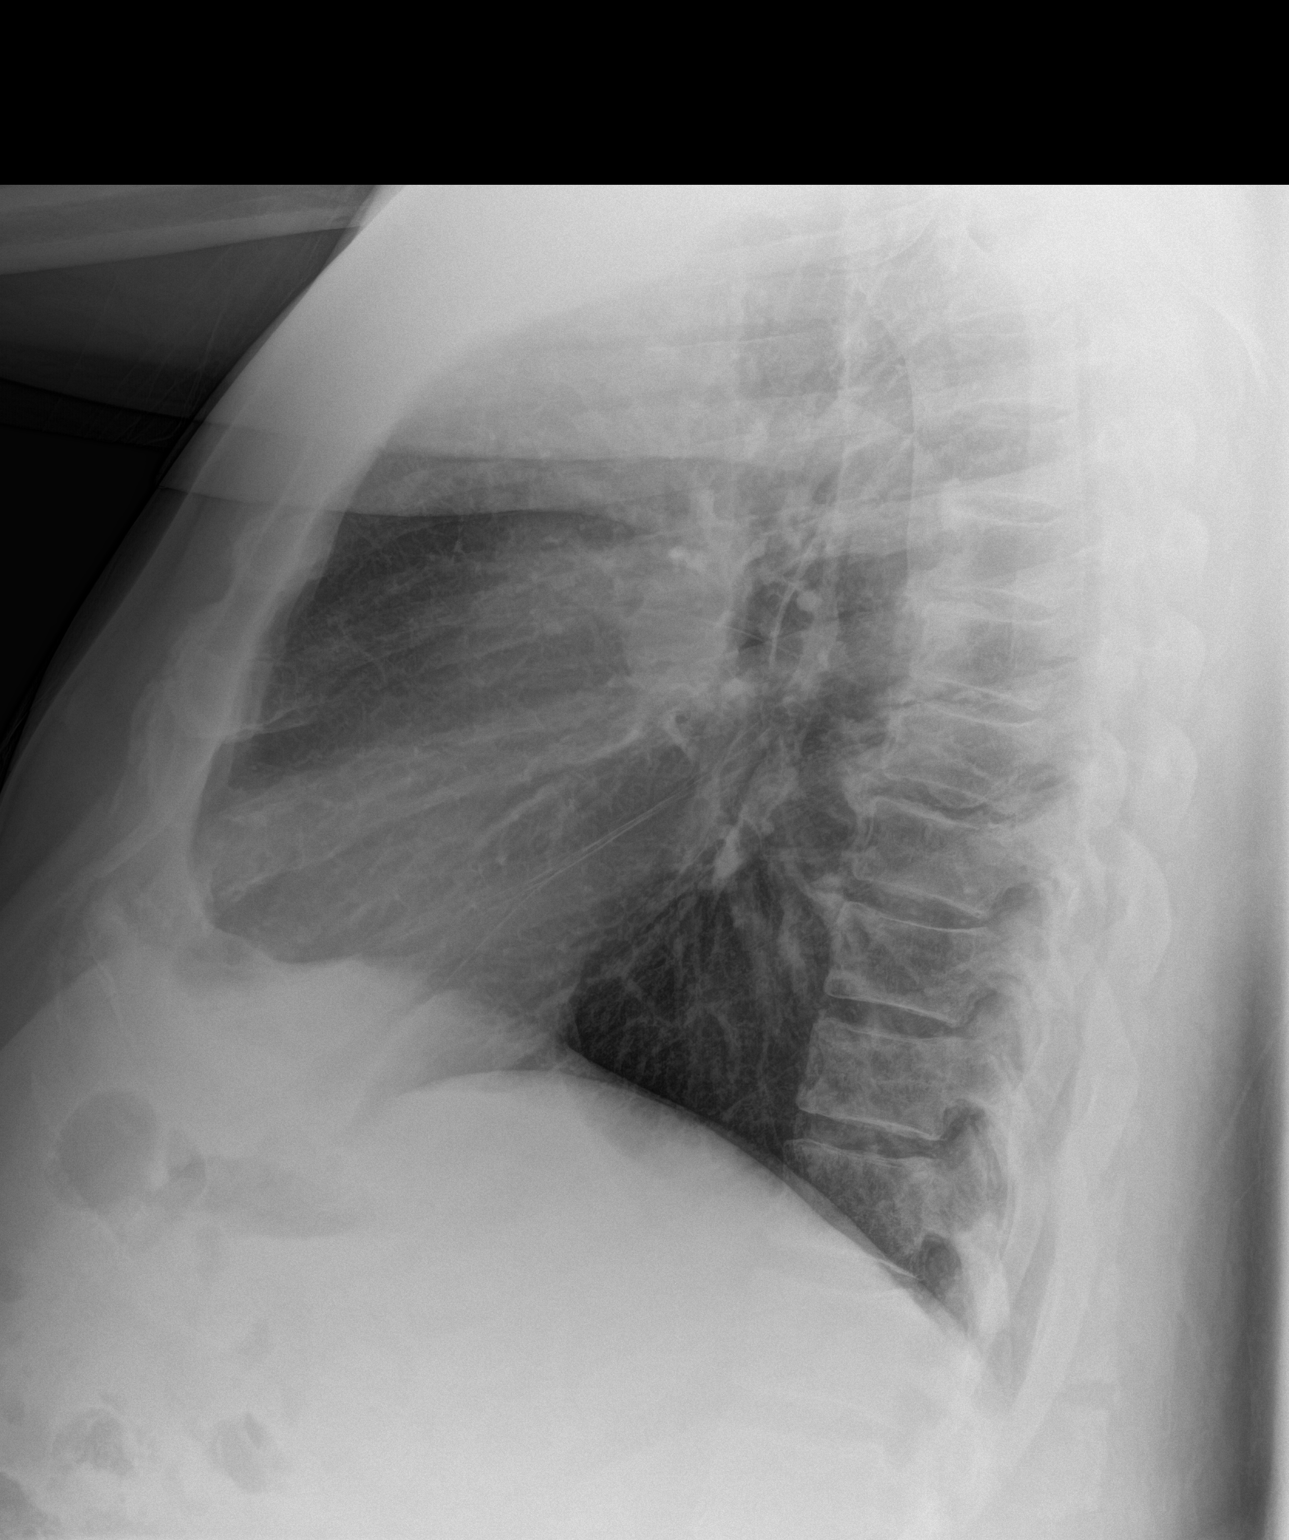

[2 of 2 positions shown; findings below may reference images not displayed]

FINDINGS: The heart size and mediastinal contours are within normal limits.
Both lungs are clear. The visualized skeletal structures are
unremarkable.
IMPRESSION: No active cardiopulmonary disease.

## 2016-09-20 IMAGING — MR MR THORACIC SPINE W/O CM
6 series · 48 of 48 positions shown · non-contrast
Comparison: None.

CLINICAL DATA: 69-year-old hypertensive male with low back pain and
right leg numbness starting this morning. Right foot numbness
yesterday. Initial encounter.

EXAM:
MRI THORACIC AND LUMBAR SPINE WITHOUT CONTRAST
TECHNIQUE: Multiplanar and multiecho pulse sequences of the thoracic and lumbar
spine were obtained without intravenous contrast.

[Series 4: T2 · sagittal · 4.0mm · 1.33mm/px · 6 of 15 slices shown (1 of 2)]
[im 1/15]
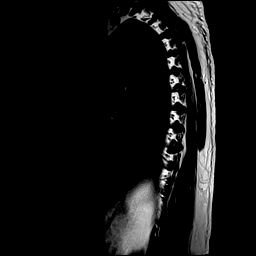
[im 3/15]
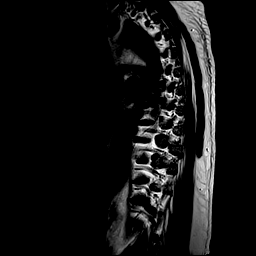
[im 6/15]
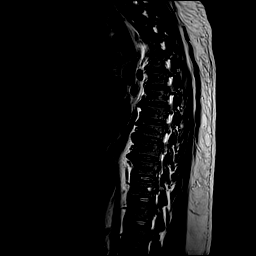
[im 9/15]
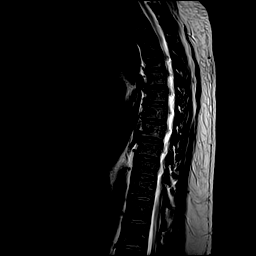
[im 12/15]
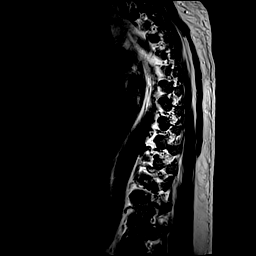
[im 15/15]
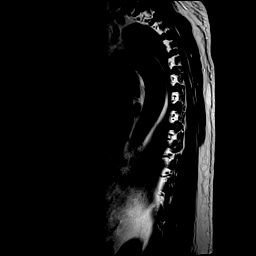

[Series 5: T1 · sagittal · 4.0mm · 1.33mm/px · 6 of 15 slices shown]
[im 1/15]
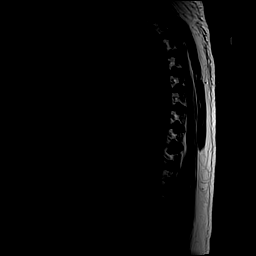
[im 3/15]
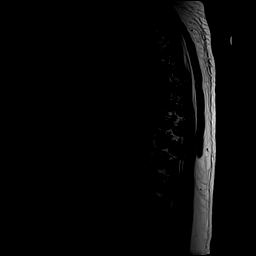
[im 6/15]
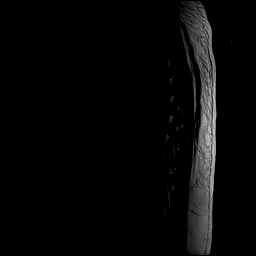
[im 9/15]
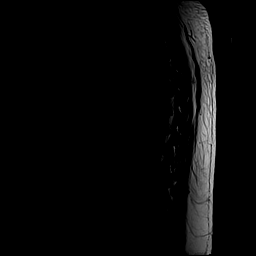
[im 12/15]
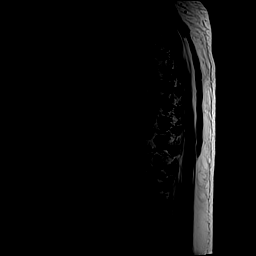
[im 15/15]
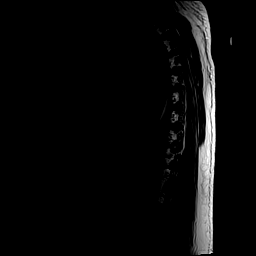

[Series 6: STIR · sagittal · 4.0mm · 1.33mm/px · 5 of 15 slices shown]
[im 1/15]
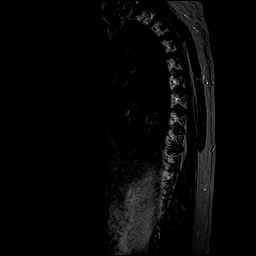
[im 4/15]
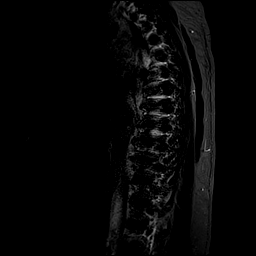
[im 8/15]
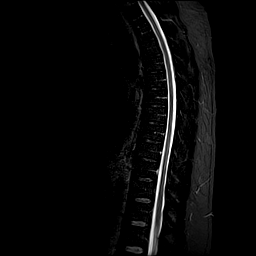
[im 11/15]
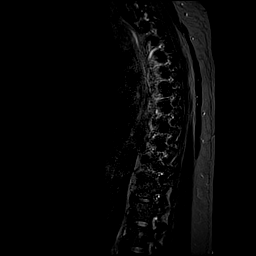
[im 15/15]
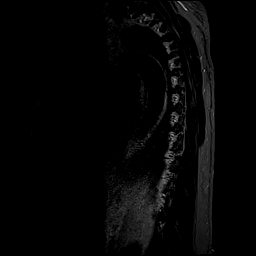

[Series 7: T2 · axial · 5.0mm · 0.86mm/px · z∈[-262,-22]mm · 14 of 39 slices shown (2 of 2)]
[im 1/39]
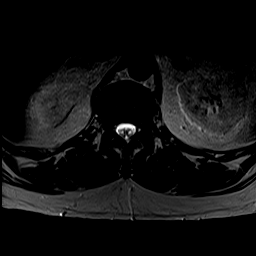
[im 3/39]
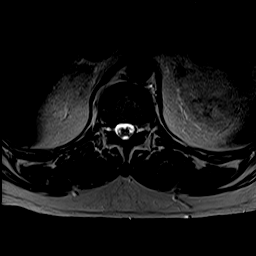
[im 6/39]
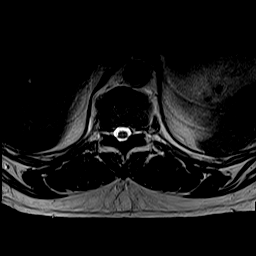
[im 9/39]
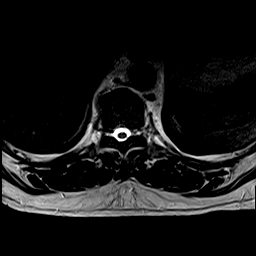
[im 12/39]
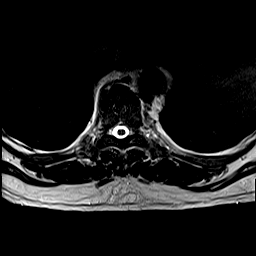
[im 15/39]
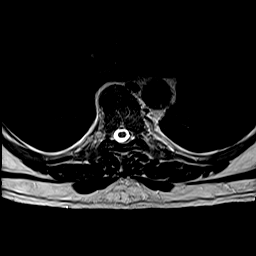
[im 18/39]
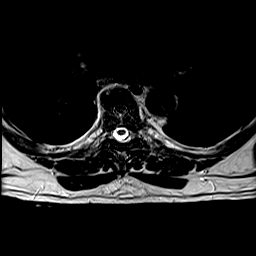
[im 21/39]
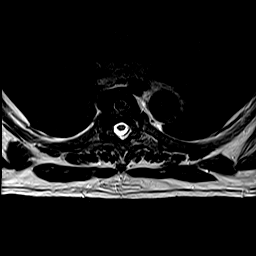
[im 24/39]
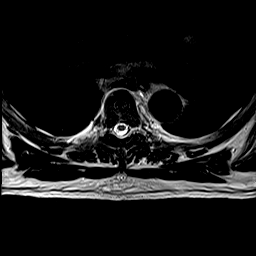
[im 27/39]
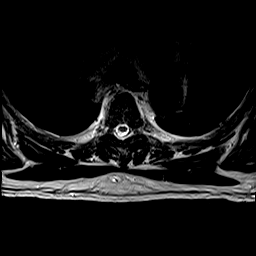
[im 30/39]
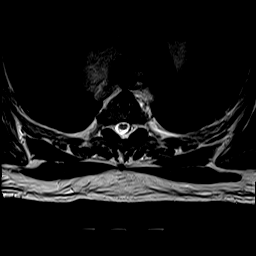
[im 33/39]
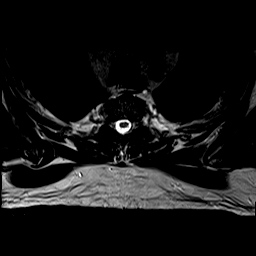
[im 36/39]
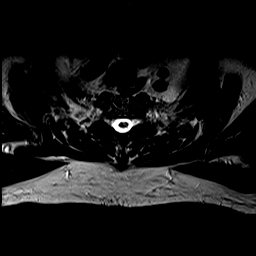
[im 39/39]
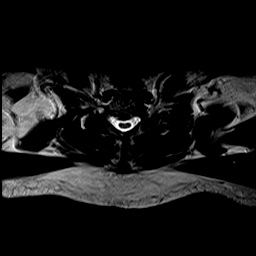

[Series 8: mpgr ax (3 · axial · 5.0mm · 0.78mm/px · z∈[-263,-17]mm · 14 of 39 slices shown]
[im 1/39]
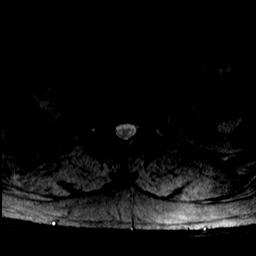
[im 3/39]
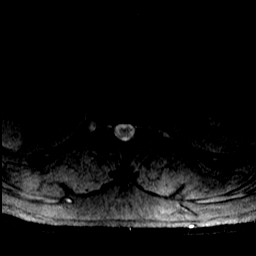
[im 6/39]
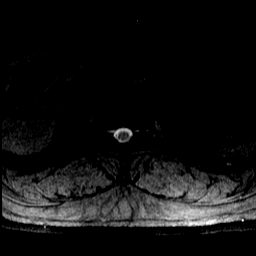
[im 9/39]
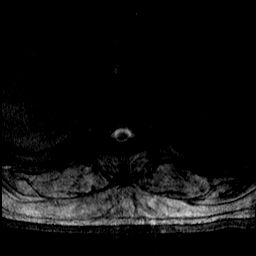
[im 12/39]
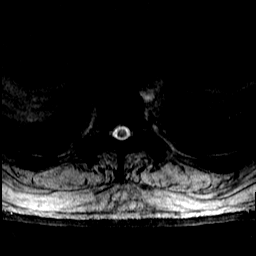
[im 15/39]
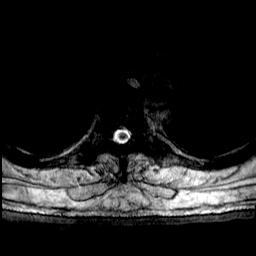
[im 18/39]
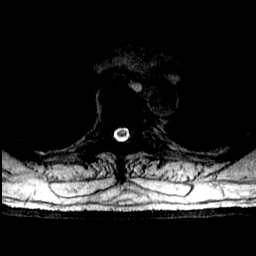
[im 21/39]
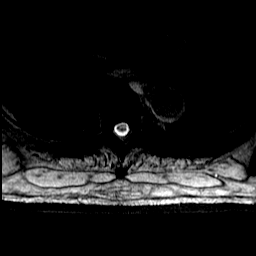
[im 24/39]
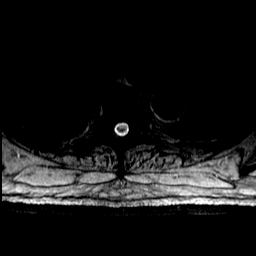
[im 27/39]
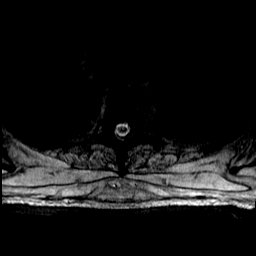
[im 30/39]
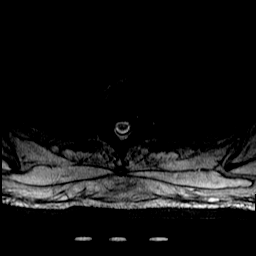
[im 33/39]
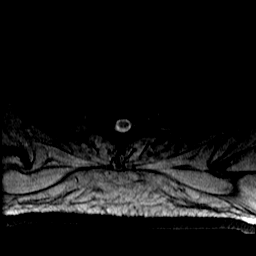
[im 36/39]
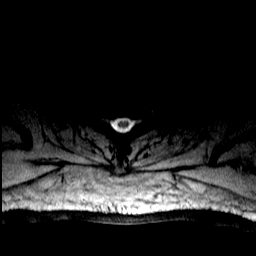
[im 39/39]
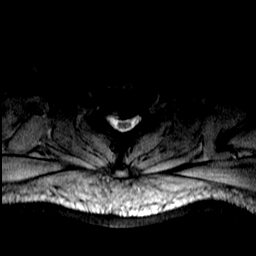

[Series 100: sag counter · sagittal · 5.0mm · 0.68mm/px · 3 of 9 slices shown]
[im 1/9]
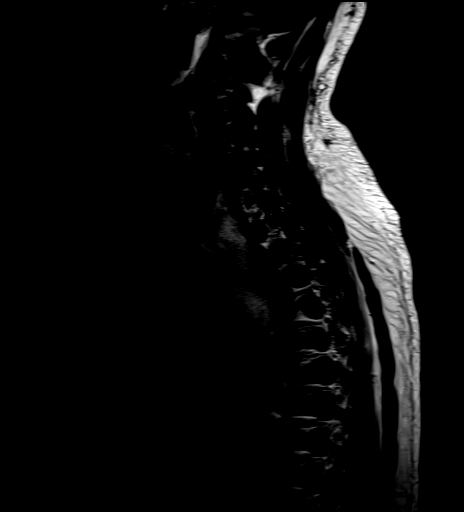
[im 5/9]
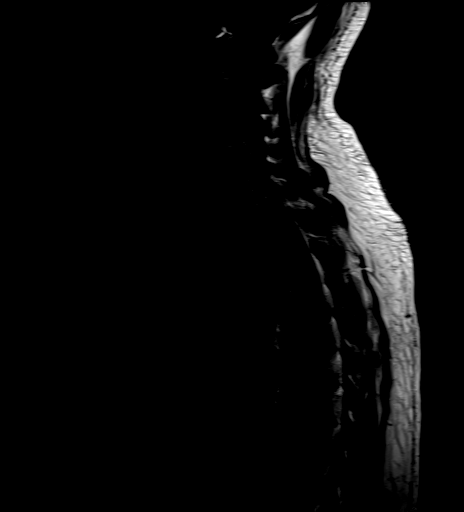
[im 9/9]
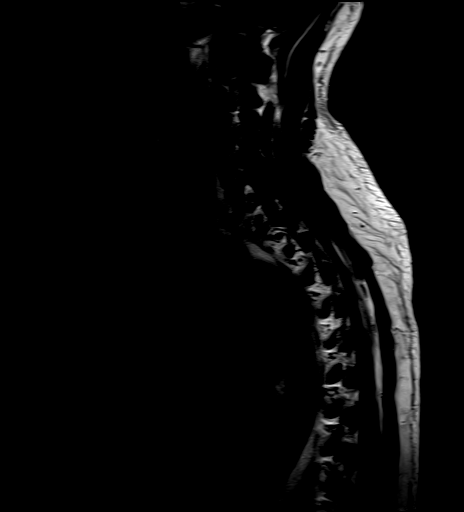

[48 of 48 positions shown; findings below may reference images not displayed]

FINDINGS: MR THORACIC SPINE FINDINGS

On the single sagittal sequence obtained for proper level assignment
which includes cervical spine, cervical spondylotic changes with
baseline narrowed cervical spinal canal is noted but incompletely
assessed.

Decreased signal intensity of bone marrow may be related to
patient's habitus. Correlation with CBC to exclude anemia
contributing to this appearance may be considered

Minimal thoracic degenerative changes without evidence of thoracic
disc herniation, significant spinal stenosis or foraminal narrowing.
No thoracic cord compression.

Slight ectasia thoracic aorta.

MR LUMBAR SPINE FINDINGS

Exam is motion degraded.

Last fully open disk space is labeled L5-S1. Present examination
incorporates from T12-L1 disc space through the S3 level.

Conus L1 level.

Decreased signal intensity of bone marrow may be related to
patient's habitus. Correlation with CBC to exclude anemia
contributing to this appearance may be considered.

Visualized paravertebral structures unremarkable. Mild dependent
subcutaneous edema incidentally noted.

L1-2:  Mild facet bony overgrowth.

L2-3: Mild facet bony overgrowth. Slightly short pedicles. Minimal
bulge. Mild spinal stenosis.

L3-4: Facet degenerative changes. Bulge. Short pedicles. Mild to
slightly moderate spinal stenosis and bilateral foraminal narrowing.

L4-5: Prominent facet joint degenerative changes. Bulge. Short
pedicles. Ligamentum flavum hypertrophy. Baseline mild spinal
stenosis, moderate bilateral lateral recess stenosis and moderate
foraminal narrowing greater on the right. Superimposed moderate size
right paracentral disc protrusion with significant compression of
the right lateral aspect of the thecal sac and origin of the right
L5 nerve root.

L5-S1: Left-sided pars defect suspected. Prominent facet joint
degenerative changes greater on the right. Minimal anterior slip L5.
Bulge slightly greater to the right. Mild to moderate foraminal
narrowing greater on the right. Mild right lateral recess stenosis.
IMPRESSION: MR THORACIC SPINE IMPRESSION

Minimal thoracic degenerative changes without evidence of thoracic
disc herniation, significant spinal stenosis or foraminal narrowing.
No thoracic cord compression.

Cervical spondylotic changes incompletely assessed on present exam.

MR LUMBAR SPINE IMPRESSION

Summary of pertinent findings includes:

L4-5 baseline multifactorial mild spinal stenosis, moderate
bilateral lateral recess stenosis and moderate foraminal narrowing
greater on the right. Superimposed moderate size right paracentral
disc protrusion with significant compression of the right lateral
aspect of the thecal sac and origin of the right L5 nerve root.

L3-4 multifactorial mild to slightly moderate spinal stenosis and
bilateral foraminal narrowing.

L5-S1 multifactorial mild to moderate foraminal narrowing greater on
the right. Mild right lateral recess stenosis.

L2-3 multifactorial mild spinal stenosis.

Please note that the exam was performed without contrast after
emergency room physician discussed case with another radiologist and
it was elected not to give contrast secondary to low GFR (34). This
limits evaluation for detection of subtle infection. Taking this
limitation into account, no obvious epidural abscess. Fluid/ edema
involving the L3-4, L4-5 and L5-S1 facet joints statistically is
related to degenerative changes rather than facet infectious
arthropathy. Close follow-up imaging recommended if patient has
progressive symptoms suggesting infection.

Decreased signal intensity of bone marrow may be related to
patient's habitus. Correlation with CBC to exclude anemia
contributing to this appearance may be considered.

## 2016-09-20 IMAGING — CR DG PELVIS 1-2V
1 series · 1 of 1 positions shown · non-contrast
Comparison: None.

CLINICAL DATA: Right leg pain radiating to lower back.

EXAM:
PELVIS - 1-2 VIEW

[pelvis ap]
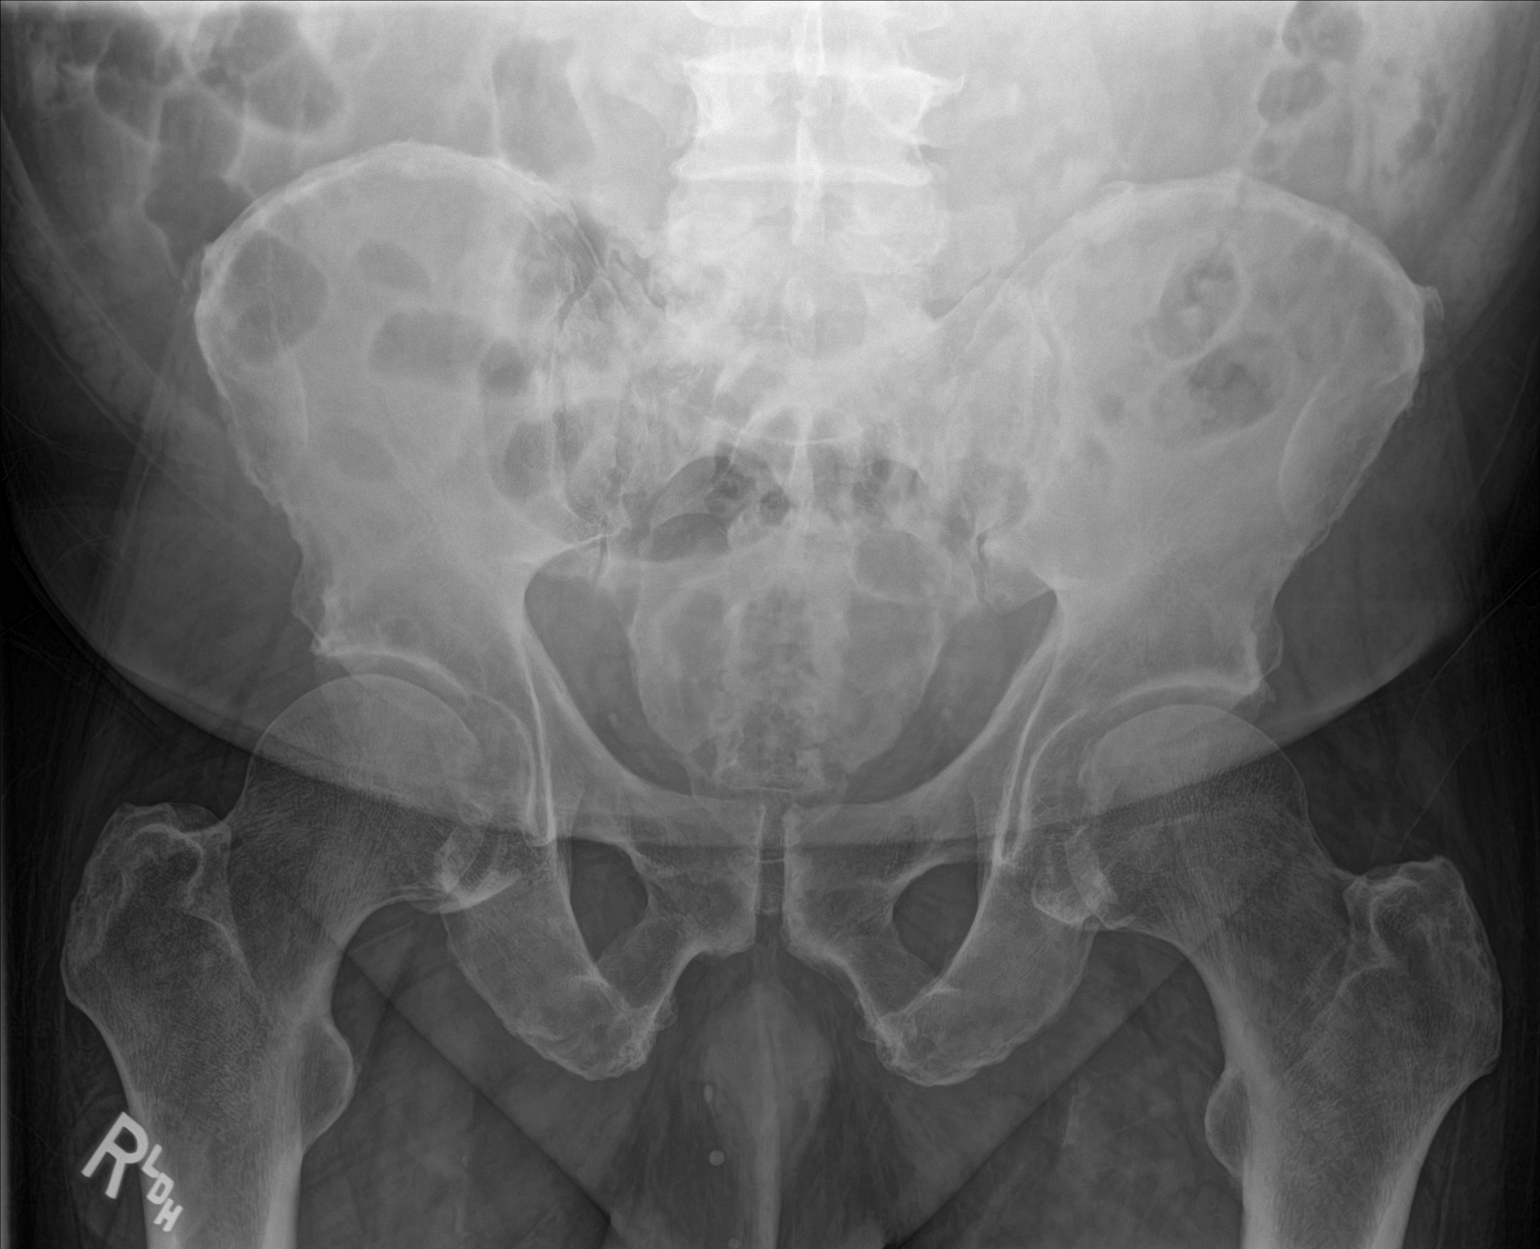

[1 of 1 positions shown; findings below may reference images not displayed]

FINDINGS: Mild symmetric degenerative changes in the hips bilaterally. SI
joints are symmetric and unremarkable. No acute bony abnormality.
Specifically, no fracture, subluxation, or dislocation. Soft tissues
are intact.
IMPRESSION: No acute bony abnormality.

## 2016-10-10 DIAGNOSIS — F250 Schizoaffective disorder, bipolar type: Secondary | ICD-10-CM | POA: Diagnosis not present

## 2016-10-29 ENCOUNTER — Telehealth: Payer: Self-pay | Admitting: *Deleted

## 2016-10-29 DIAGNOSIS — I10 Essential (primary) hypertension: Secondary | ICD-10-CM

## 2016-10-29 MED ORDER — LOSARTAN POTASSIUM 100 MG PO TABS: 100.0000 mg | ORAL_TABLET | Freq: Every day | ORAL | 1 refills | Status: DC

## 2016-10-29 NOTE — Telephone Encounter (Signed)
Sent, needs f/u with labs prior to visit with me.  Thanks.

## 2016-10-29 NOTE — Telephone Encounter (Signed)
No answer.  Mailbox is full, cannot leave message. 

## 2016-10-29 NOTE — Telephone Encounter (Signed)
[facility] at [facility] stating that patient has a script for Losartan and to take 1/2 daily. Patient's wife stated that he is out of his medication because he has been taking a whole one to help control his blood pressure. Patient needs a new script sent with new directions. Looks like patient is over due for a follow-up.

## 2016-10-30 NOTE — Telephone Encounter (Signed)
Spoke with patient who says he will call in later for an appointment with Calvin Hall.

## 2016-12-18 DIAGNOSIS — F250 Schizoaffective disorder, bipolar type: Secondary | ICD-10-CM | POA: Diagnosis not present

## 2017-03-07 ENCOUNTER — Telehealth: Payer: Self-pay | Admitting: Internal Medicine

## 2017-03-07 DIAGNOSIS — F250 Schizoaffective disorder, bipolar type: Secondary | ICD-10-CM | POA: Diagnosis not present

## 2017-03-07 NOTE — Telephone Encounter (Signed)
Patient's wife,Calvin Hall,came by the office and asked for [facility] to [facility]  Patient's wife said there's no reason for the switch. Can patient switch?

## 2017-03-07 NOTE — Telephone Encounter (Signed)
Okay with me Can set him up for a Medicare wellness/follow up in the next few months--not new patient. 30 minutes

## 2017-03-07 NOTE — Telephone Encounter (Signed)
Fine with me

## 2017-03-11 ENCOUNTER — Ambulatory Visit (INDEPENDENT_AMBULATORY_CARE_PROVIDER_SITE_OTHER): Payer: Medicare Other | Admitting: Internal Medicine

## 2017-03-11 ENCOUNTER — Ambulatory Visit (INDEPENDENT_AMBULATORY_CARE_PROVIDER_SITE_OTHER)
Admission: RE | Admit: 2017-03-11 | Discharge: 2017-03-11 | Disposition: A | Discharge status: Home / Self Care | Payer: Medicare Other | Source: Ambulatory Visit | Attending: Internal Medicine | Admitting: Internal Medicine

## 2017-03-11 ENCOUNTER — Encounter: Payer: Self-pay | Admitting: Internal Medicine

## 2017-03-11 VITALS — BP 194/110 | HR 84 | Temp 97.7°F | Ht 71.5 in | Wt 230.0 lb

## 2017-03-11 DIAGNOSIS — F339 Major depressive disorder, recurrent, unspecified: Secondary | ICD-10-CM

## 2017-03-11 DIAGNOSIS — Z1159 Encounter for screening for other viral diseases: Secondary | ICD-10-CM | POA: Diagnosis not present

## 2017-03-11 DIAGNOSIS — M25511 Pain in right shoulder: Secondary | ICD-10-CM

## 2017-03-11 DIAGNOSIS — F329 Major depressive disorder, single episode, unspecified: Secondary | ICD-10-CM | POA: Diagnosis not present

## 2017-03-11 DIAGNOSIS — I10 Essential (primary) hypertension: Secondary | ICD-10-CM | POA: Diagnosis not present

## 2017-03-11 DIAGNOSIS — F32A Depression, unspecified: Secondary | ICD-10-CM

## 2017-03-11 DIAGNOSIS — M47812 Spondylosis without myelopathy or radiculopathy, cervical region: Secondary | ICD-10-CM | POA: Diagnosis not present

## 2017-03-11 DIAGNOSIS — R200 Anesthesia of skin: Secondary | ICD-10-CM

## 2017-03-11 DIAGNOSIS — R202 Paresthesia of skin: Secondary | ICD-10-CM

## 2017-03-11 DIAGNOSIS — Z0000 Encounter for general adult medical examination without abnormal findings: Secondary | ICD-10-CM

## 2017-03-11 LAB — COMPREHENSIVE METABOLIC PANEL
ALT: 19 U/L (ref 0–53)
AST: 19 U/L (ref 0–37)
Albumin: 3.9 g/dL (ref 3.5–5.2)
Alkaline Phosphatase: 69 U/L (ref 39–117)
BUN: 13 mg/dL (ref 6–23)
CHLORIDE: 101 meq/L (ref 96–112)
CO2: 31 meq/L (ref 19–32)
CREATININE: 1.31 mg/dL (ref 0.40–1.50)
Calcium: 9.9 mg/dL (ref 8.4–10.5)
GFR: 69.08 mL/min (ref >60.00)
Glucose, Bld: 102 mg/dL — ABNORMAL HIGH (ref 70–99)
Potassium: 3.8 mEq/L (ref 3.5–5.1)
Sodium: 138 mEq/L (ref 135–145)
Total Bilirubin: 0.5 mg/dL (ref 0.2–1.2)
Total Protein: 7.2 g/dL (ref 6.0–8.3)

## 2017-03-11 LAB — CBC
HCT: 47.9 % (ref 39.0–52.0)
Hemoglobin: 16.4 g/dL (ref 13.0–17.0)
MCHC: 34.2 g/dL (ref 30.0–36.0)
MCV: 83.4 fl (ref 78.0–100.0)
Platelets: 245 10*3/uL (ref 150.0–400.0)
RBC: 5.75 Mil/uL (ref 4.22–5.81)
RDW: 13.7 % (ref 11.5–15.5)
WBC: 6.3 10*3/uL (ref 4.0–10.5)

## 2017-03-11 LAB — LIPID PANEL
CHOL/HDL RATIO: 4
Cholesterol: 163 mg/dL (ref 0–200)
HDL: 44.9 mg/dL (ref >39.00)
LDL CALC: 89 mg/dL (ref 0–99)
NonHDL: 118.46
Triglycerides: 145 mg/dL (ref 0.0–149.0)
VLDL: 29 mg/dL (ref 0.0–40.0)

## 2017-03-11 MED ORDER — HYDRALAZINE HCL 50 MG PO TABS: 50.0000 mg | ORAL_TABLET | Freq: Two times a day (BID) | ORAL | 3 refills | Status: AC

## 2017-03-11 MED ORDER — TRIAMTERENE-HCTZ 75-50 MG PO TABS: ORAL_TABLET | ORAL | 3 refills | Status: DC

## 2017-03-11 MED ORDER — LOSARTAN POTASSIUM 100 MG PO TABS: 100.0000 mg | ORAL_TABLET | Freq: Every day | ORAL | 3 refills | Status: AC

## 2017-03-11 NOTE — Progress Notes (Signed)
HPI:  Pt presents to the clinic today for his Medicare Wellness Exam. He is also due to follow up chronic conditions.  HTN: His BP today is 194/110. He is not taking Hydralazine, Losartan and Triamterene HCT as prescribed because he ran out and never followed up. He denies headaches, dizziness There is no ECG on file.  Depression: Chronic but stable on Cogentin, Haldol and Zoloft.  He also c/o right shoulder pain. This started a few weeks ago. He describes the pain as sore and achy. He does have some associated numbness in his right arm. He denies tingling or weakness. The pain is worse if he turns his head to the left. He denies any injury to the area. He has not taken anything OTC for his symptoms.   Past Medical History:  Diagnosis Date  . Depression   . Hypertension     Current Outpatient Medications  Medication Sig Dispense Refill  . benztropine (COGENTIN) 0.5 MG tablet Take 1 tablet by mouth at bedtime.    . haloperidol (HALDOL) 1 MG tablet Take 1 tablet by mouth at bedtime.    . haloperidol (HALDOL) 2 MG tablet Take 2 mg by mouth at bedtime.    . hydrALAZINE (APRESOLINE) 50 MG tablet Take 50 mg by mouth 2 (two) times daily. Reported on 06/30/2015    . losartan (COZAAR) 100 MG tablet Take 1 tablet (100 mg total) by mouth daily. 30 tablet 1  . sertraline (ZOLOFT) 50 MG tablet Take 50 mg by mouth at bedtime.    . triamterene-hydrochlorothiazide (MAXZIDE) 75-50 MG tablet TAKE 1 TABLET BY MOUTH DAILY FOR FLUID/BLOOD PRESSURE 30 tablet 0   No current facility-administered medications for this visit.     No Known Allergies  Family History  Problem Relation Age of Onset  . Lung cancer Father   . Tuberculosis Mother   . Heart failure Sister     Social History   Socioeconomic History  . Marital status: Single    Spouse name: Not on file  . Number of children: Not on file  . Years of education: Not on file  . Highest education level: Not on file  Social Needs  . Financial  resource strain: Not on file  . Food insecurity - worry: Not on file  . Food insecurity - inability: Not on file  . Transportation needs - medical: Not on file  . Transportation needs - non-medical: Not on file  Occupational History  . Not on file  Tobacco Use  . Smoking status: Former Games developer  . Smokeless tobacco: Never Used  Substance and Sexual Activity  . Alcohol use: No    Alcohol/week: 0.0 oz  . Drug use: No  . Sexual activity: No  Other Topics Concern  . Not on file  Social History Narrative  . Not on file    Hospitiliaztions: None  Health Maintenance:    Flu: 11/2014  Tetanus: > 10 years ago  Pneumovax: never  Prevnar: never  Zostavax: never  PSA: unsure  Colon Screening: never  Eye Doctor: as needed  Dental Exam: as needed   Providers:   PCP: Nicki Reaper, NP-C  Psychiatry: He can not remember the name, in [place].    I have personally reviewed and have noted:  1. The patient's medical and social history 2. Their use of alcohol, tobacco or illicit drugs 3. Their current medications and supplements 4. The patient's functional ability including ADL's, fall risks, home safety risks and hearing or visual impairment. 5.  Diet and physical activities 6. Evidence for depression or mood disorder  Subjective:   Review of Systems:   Constitutional: Denies fever, malaise, fatigue, headache or abrupt weight changes.  HEENT: Denies eye pain, eye redness, ear pain, ringing in the ears, wax buildup, runny nose, nasal congestion, bloody nose, or sore throat. Respiratory: Denies difficulty breathing, shortness of breath, cough or sputum production.   Cardiovascular: Denies chest pain, chest tightness, palpitations or swelling in the hands or feet.  Gastrointestinal: Pt reports constipation. Denies abdominal pain, bloating, diarrhea or blood in the stool.  GU: Denies urgency, frequency, pain with urination, burning sensation, blood in urine, odor or  discharge. Musculoskeletal: Pt reports right shoulder pain. Denies decrease in range of motion, difficulty with gait, muscle pain or joint swelling.  Skin: Denies redness, rashes, lesions or ulcercations.  Neurological: Pt reports numbness in right arm. Denies dizziness, difficulty with memory, difficulty with speech or problems with balance and coordination.  Psych: Pt has a history of depression. Denies anxiety, SI/HI.  No other specific complaints in a complete review of systems (except as listed in HPI above).  Objective:  PE:   BP (!) 194/110   Pulse 84   Temp 97.7 F (36.5 C) (Oral)   Ht 5' 11.5" (1.816 m)   Wt 230 lb (104.3 kg)   SpO2 98%   BMI 31.63 kg/m   Wt Readings from Last 3 Encounters:  06/21/16 249 lb 8 oz (113.2 kg)  07/22/15 293 lb (132.9 kg)  06/30/15 289 lb (131.1 kg)    General: Appears his stated age, obese, chronically ill appearing, in NAD. Skin: Warm, dry and intact.  HEENT: Head: normal shape and size; Eyes: sclera white, no icterus, conjunctiva pink, PERRLA and EOMs intact; Throat/Mouth: mucosa pink and moist, no exudate, lesions or ulcerations noted.  Neck: Neck supple, trachea midline. No masses, lumps or thyromegaly present.  Cardiovascular: Normal rate and rhythm. S1,S2 noted.  No murmur, rubs or gallops noted. No JVD or BLE edema. No carotid bruits noted. Pulmonary/Chest: Normal effort and positive vesicular breath sounds. No respiratory distress. No wheezes, rales or ronchi noted.  Abdomen: Soft and nontender. Normal bowel sounds. No distention or masses noted.  Musculoskeletal: Normal flexion, extension and rotation of the cervical spine. Normal internal and external rotation of the right shoulder. NO bony tenderness over the spine or right shoulder. Strength 5/5 BUE/BLE.  Neurological: Alert and oriented.  Psychiatric: Mood and affect flat.  BMET    Component Value Date/Time   NA 141 06/30/2015 1622   K 4.0 06/30/2015 1622   CL 104  06/30/2015 1622   CO2 30 06/30/2015 1622   GLUCOSE 112 (H) 06/30/2015 1622   BUN 21 06/30/2015 1622   CREATININE 1.56 (H) 06/30/2015 1622   CREATININE 1.40 (H) 11/24/2014 1608   CALCIUM 9.8 06/30/2015 1622   GFRNONAA 32 (L) 09/21/2014 0503   GFRAA 37 (L) 09/21/2014 0503    Lipid Panel     Component Value Date/Time   CHOL 201 (H) 11/24/2014 1608   TRIG 222 (H) 11/24/2014 1608   HDL 41 11/24/2014 1608   CHOLHDL 4.9 11/24/2014 1608   VLDL 44 (H) 11/24/2014 1608   LDLCALC 116 11/24/2014 1608    CBC    Component Value Date/Time   WBC 7.8 11/24/2014 1608   RBC 5.47 11/24/2014 1608   HGB 15.5 11/24/2014 1608   HCT 46.0 11/24/2014 1608   PLT 237 11/24/2014 1608   MCV 84.1 11/24/2014 1608   MCH  28.3 11/24/2014 1608   MCHC 33.7 11/24/2014 1608   RDW 14.5 11/24/2014 1608   LYMPHSABS 0.9 (L) 09/20/2014 1251   MONOABS 2.2 (H) 09/20/2014 1251   EOSABS 0.0 09/20/2014 1251   BASOSABS 0.0 09/20/2014 1251    Hgb A1C Lab Results  Component Value Date   HGBA1C 6.0 (H) 11/24/2014      Assessment and Plan:   Medicare Annual Wellness Visit:  Diet: He does eat meat. He consumes fruits and veggies. He does eat fried food. He drinks mostly water. Physical activity: Sedentary Depression/mood screen: Chronic, on meds. Hearing: Intact to whispered voice Visual acuity: Grossly normal, performs annual eye exam  ADLs: Capable Fall risk: High risk Home safety: Good Cognitive evaluation: Intact to orientation, naming, recall and repetition EOL planning: No adv directives, full code/ I agree  Preventative Medicine: He declines flu, tetanus, pneumovax, prevnar or zostovax. He declines PSA and colon cancer screening. Encouraged him to consume a balanced diet and exercise regimen. Advised him to see an eye doctor and dentist annually. Will check CBC, CMET, Lipid and Hep C today.  Right Shoulder Pain:  Xray right shoulder today  Numbness in Right Arm:  Xray cervical spine  today    Next appointment:   Nicki ReaperBAITY, Elih Mooney, NP

## 2017-03-11 NOTE — Patient Instructions (Signed)

## 2017-03-11 NOTE — Assessment & Plan Note (Signed)
Uncontrolled off meds Will refill his meds today CBC and CMET today Discussed DASH diet and exercise for weight loss  RTC in 3 weeks for follow up HTN

## 2017-03-11 NOTE — Assessment & Plan Note (Signed)
Controlled on multiple meds He will continue to follow with psych

## 2017-03-12 LAB — HEPATITIS C ANTIBODY
Hepatitis C Ab: NONREACTIVE
SIGNAL TO CUT-OFF: 0.02 (ref <1.00)

## 2017-03-19 ENCOUNTER — Other Ambulatory Visit: Payer: Self-pay | Admitting: Internal Medicine

## 2017-03-19 DIAGNOSIS — M5412 Radiculopathy, cervical region: Secondary | ICD-10-CM

## 2017-03-22 ENCOUNTER — Telehealth: Payer: Self-pay | Admitting: Internal Medicine

## 2017-03-22 DIAGNOSIS — M5412 Radiculopathy, cervical region: Secondary | ICD-10-CM | POA: Diagnosis not present

## 2017-03-22 DIAGNOSIS — M4722 Other spondylosis with radiculopathy, cervical region: Secondary | ICD-10-CM | POA: Diagnosis not present

## 2017-03-22 NOTE — Telephone Encounter (Signed)
Copied from CRM [phone]. Topic: General - Other >> Mar 22, 2017 12:21 PM Cecelia ByarsGreen, Debora Stockdale L, RMA wrote: Reason for CRM: Patient is requesting a call back concerning elevated Bp with a reading of 179/115 [facility] and was instructed by [facility] to call PCP, offered to make pt an appt however there is no availability today and Nicki ReaperRegina Baity is out of the office, please call pt

## 2017-03-22 NOTE — Telephone Encounter (Signed)
I spoke with Mrs Calvin Hall (DPR signed) [facility] for shoulder problem and BP was elevated at 179/115 and pt was advised to contact PCP.No H/A,dizziness,CP,SOB or vision changes. Pt has not missed any BP meds according to Mrs Calvin Hall. Please advise. [facility].

## 2017-03-22 NOTE — Telephone Encounter (Signed)
Put him on the schedule for Monday. If he is not having any CP, dizziness, shortness of breath etc, he should go straight to the ER.

## 2017-03-22 NOTE — Telephone Encounter (Signed)
Called pt, no answer and VM full unable to lm

## 2017-03-25 ENCOUNTER — Other Ambulatory Visit: Payer: Self-pay | Admitting: Student

## 2017-03-25 DIAGNOSIS — M5412 Radiculopathy, cervical region: Secondary | ICD-10-CM

## 2017-03-30 ENCOUNTER — Ambulatory Visit
Admission: RE | Admit: 2017-03-30 | Discharge: 2017-03-30 | Disposition: A | Discharge status: Home / Self Care | Payer: Medicare Other | Source: Ambulatory Visit | Attending: Student | Admitting: Student

## 2017-03-30 DIAGNOSIS — S199XXA Unspecified injury of neck, initial encounter: Secondary | ICD-10-CM | POA: Diagnosis not present

## 2017-03-30 DIAGNOSIS — M50123 Cervical disc disorder at C6-C7 level with radiculopathy: Secondary | ICD-10-CM | POA: Diagnosis not present

## 2017-03-30 DIAGNOSIS — M4802 Spinal stenosis, cervical region: Secondary | ICD-10-CM | POA: Diagnosis not present

## 2017-03-30 DIAGNOSIS — M5412 Radiculopathy, cervical region: Secondary | ICD-10-CM

## 2017-04-02 ENCOUNTER — Encounter: Payer: Medicare Other | Admitting: Internal Medicine

## 2017-05-13 DIAGNOSIS — M5412 Radiculopathy, cervical region: Secondary | ICD-10-CM | POA: Diagnosis not present

## 2017-05-13 DIAGNOSIS — M5030 Other cervical disc degeneration, unspecified cervical region: Secondary | ICD-10-CM | POA: Diagnosis not present

## 2017-05-22 DIAGNOSIS — F250 Schizoaffective disorder, bipolar type: Secondary | ICD-10-CM | POA: Diagnosis not present

## 2017-06-05 ENCOUNTER — Ambulatory Visit (INDEPENDENT_AMBULATORY_CARE_PROVIDER_SITE_OTHER): Payer: Medicare Other | Admitting: Internal Medicine

## 2017-06-05 ENCOUNTER — Encounter: Payer: Self-pay | Admitting: Internal Medicine

## 2017-06-05 VITALS — BP 162/98 | HR 90 | Temp 98.1°F | Ht 71.5 in | Wt 221.0 lb

## 2017-06-05 DIAGNOSIS — Z7189 Other specified counseling: Secondary | ICD-10-CM | POA: Diagnosis not present

## 2017-06-05 DIAGNOSIS — F339 Major depressive disorder, recurrent, unspecified: Secondary | ICD-10-CM | POA: Diagnosis not present

## 2017-06-05 DIAGNOSIS — I10 Essential (primary) hypertension: Secondary | ICD-10-CM | POA: Diagnosis not present

## 2017-06-05 DIAGNOSIS — Z0000 Encounter for general adult medical examination without abnormal findings: Secondary | ICD-10-CM

## 2017-06-05 MED ORDER — AMLODIPINE BESYLATE 5 MG PO TABS: 5.0000 mg | ORAL_TABLET | Freq: Every day | ORAL | 3 refills | Status: DC

## 2017-06-05 NOTE — Assessment & Plan Note (Signed)
See social history 

## 2017-06-05 NOTE — Assessment & Plan Note (Signed)
BP Readings from Last 3 Encounters:  06/05/17 (!) 162/98  03/11/17 (!) 194/110  06/21/16 (!) 156/100   Repeat 172/90 on right  Will add amlodipine Recent labs okay

## 2017-06-05 NOTE — Assessment & Plan Note (Signed)
Sees psychiatrist Mood seems to be okay on his meds

## 2017-06-05 NOTE — Progress Notes (Signed)
Subjective:    Patient ID: Calvin Hall, male    DOB: February 12, 1945, 72 y.o.   MRN: [id number]  HPI Here to establish care with wife For Medicare wellness visit also Reviewed form and advanced directives  Reviewed other doctors No alcohol or tobacco No falls in the past year Chronic depression Vision is okay No major issues with hearing Independent with instrumental ADLs---doesn't drive (never did) Memory seems to be fine. Did graduate high school.  Has chronic MDD Sees behavioral health--they don't remember the name Was hospitalized in [place] years ago Seems to be well controlled now  Long standing HTN Runs high regularly Has tried different medications Is taking his meds regularly No dizziness or syncope No edema No chest pain or SOB  Has cervical radiculopathy Just had injection from Dr Yves Dillhasnis --this helped No arm weakness  Current Outpatient Medications on File Prior to Visit  Medication Sig Dispense Refill  . benztropine (COGENTIN) 0.5 MG tablet Take 1 tablet by mouth at bedtime.    . haloperidol (HALDOL) 2 MG tablet Take 2 mg by mouth at bedtime.    . hydrALAZINE (APRESOLINE) 50 MG tablet Take 1 tablet (50 mg total) by mouth 2 (two) times daily. Reported on 06/30/2015 180 tablet 3  . losartan (COZAAR) 100 MG tablet Take 1 tablet (100 mg total) by mouth daily. 90 tablet 3  . sertraline (ZOLOFT) 50 MG tablet Take 50 mg by mouth at bedtime.    . triamterene-hydrochlorothiazide (MAXZIDE) 75-50 MG tablet TAKE 1 TABLET BY MOUTH DAILY FOR FLUID/BLOOD PRESSURE 90 tablet 3   No current facility-administered medications on file prior to visit.     No Known Allergies  Past Medical History:  Diagnosis Date  . Cervical spine arthritis with nerve pain   . Depression   . Hypertension     Past Surgical History:  Procedure Laterality Date  . NO PAST SURGERIES      Family History  Problem Relation Age of Onset  . Tuberculosis Mother   . Lung cancer Father   .  Heart failure Sister   . Diabetes Sister        not sure    Social History   Socioeconomic History  . Marital status: Married    Spouse name: Not on file  . Number of children: 6  . Years of education: Not on file  . Highest education level: Not on file  Occupational History  . Occupation: Surveyor, miningKitchen work ---washing [employer]    Comment: Retired  Engineer, productionocial Needs  . Financial resource strain: Not on file  . Food insecurity:    Worry: Not on file    Inability: Not on file  . Transportation needs:    Medical: Not on file    Non-medical: Not on file  Tobacco Use  . Smoking status: Former Games developermoker  . Smokeless tobacco: Never Used  Substance and Sexual Activity  . Alcohol use: No    Alcohol/week: 0.0 oz  . Drug use: No  . Sexual activity: Never  Lifestyle  . Physical activity:    Days per week: Not on file    Minutes per session: Not on file  . Stress: Not on file  Relationships  . Social connections:    Talks on phone: Not on file    Gets together: Not on file    Attends religious service: Not on file    Active member of club or organization: Not on file    Attends meetings of clubs or organizations:  Not on file    Relationship status: Not on file  . Intimate partner violence:    Fear of current or ex partner: Not on file    Emotionally abused: Not on file    Physically abused: Not on file    Forced sexual activity: Not on file  Other Topics Concern  . Not on file  Social History Narrative   No living will   Would want wife to make decision---alternate is all children   Would accept resuscitation   No tube feeds if cognitively unaware   Review of Systems  Appetite is good Weight stable Sleeps fine Wears seat belt Poor teeth----needs extractions No heartburn or dysphagia No headaches Bowels move regularly. No blood Voids well. Nocturia x 1 generally. Empties fine No skin rash    Objective:   Physical Exam  Constitutional: He is oriented to person, place, and  time. He appears well-developed. No distress.  HENT:  Mouth/Throat: Oropharynx is clear and moist. No oropharyngeal exudate.  Neck: No thyromegaly present.  Cardiovascular: Normal rate, regular rhythm, normal heart sounds and intact distal pulses. Exam reveals no gallop.  No murmur heard. Respiratory: Effort normal and breath sounds normal. No respiratory distress. He has no wheezes. He has no rales.  GI: Soft. There is no tenderness.  Musculoskeletal: He exhibits no edema or tenderness.  Lymphadenopathy:    He has no cervical adenopathy.  Neurological: He is alert and oriented to person, place, and time.  President-- "Trump, ?" 100-? [id number] Recall 3/3  Skin: No rash noted. No erythema.  Large warts on left 2nd and 3rd mid phalanxes  Psychiatric: He has a normal mood and affect. His behavior is normal.           Assessment & Plan:

## 2017-06-05 NOTE — Assessment & Plan Note (Signed)
I have personally reviewed the Medicare Annual Wellness questionnaire and have noted 1. The patient's medical and social history 2. Their use of alcohol, tobacco or illicit drugs 3. Their current medications and supplements 4. The patient's functional ability including ADL's, fall risks, home safety risks and hearing or visual             impairment. 5. Diet and physical activities 6. Evidence for depression or mood disorders  The patients weight, height, BMI and visual acuity have been recorded in the chart I have made referrals, counseling and provided education to the patient based review of the above and I have provided the pt with a written personalized care plan for preventive services.  I have provided you with a copy of your personalized plan for preventive services. Please take the time to review along with your updated medication list.  Does not want vaccines or cancer screening Discussed starting to exercise DASH eating

## 2017-06-05 NOTE — Patient Instructions (Addendum)
You can try over the counter wart treatments for the warts on your left hand. Please start the amlodipine every day. Let me know if you have any problems (like dizziness or foot swelling).   DASH Eating Plan DASH stands for "Dietary Approaches to Stop Hypertension." The DASH eating plan is a healthy eating plan that has been shown to reduce high blood pressure (hypertension). It may also reduce your risk for type 2 diabetes, heart disease, and stroke. The DASH eating plan may also help with weight loss. What are tips for following this plan? General guidelines  Avoid eating more than 2,300 mg (milligrams) of salt (sodium) a day. If you have hypertension, you may need to reduce your sodium intake to 1,500 mg a day.  Limit alcohol intake to no more than 1 drink a day for nonpregnant women and 2 drinks a day for men. One drink equals 12 oz of beer, 5 oz of wine, or 1 oz of hard liquor.  Work with your health care provider to maintain a healthy body weight or to lose weight. Ask what an ideal weight is for you.  Get at least 30 minutes of exercise that causes your heart to beat faster (aerobic exercise) most days of the week. Activities may include walking, swimming, or biking.  Work with your health care provider or diet and nutrition specialist (dietitian) to adjust your eating plan to your individual calorie needs. Reading food labels  Check food labels for the amount of sodium per serving. Choose foods with less than 5 percent of the Daily Value of sodium. Generally, foods with less than 300 mg of sodium per serving fit into this eating plan.  To find whole grains, look for the word "whole" as the first word in the ingredient list. Shopping  Buy products labeled as "low-sodium" or "no salt added."  Buy fresh foods. Avoid canned foods and premade or frozen meals. Cooking  Avoid adding salt when cooking. Use salt-free seasonings or herbs instead of table salt or sea salt. Check with your  health care provider or pharmacist before using salt substitutes.  Do not fry foods. Cook foods using healthy methods such as baking, boiling, grilling, and broiling instead.  Cook with heart-healthy oils, such as olive, canola, soybean, or sunflower oil. Meal planning   Eat a balanced diet that includes: ? 5 or more servings of fruits and vegetables each day. At each meal, try to fill half of your plate with fruits and vegetables. ? Up to 6-8 servings of whole grains each day. ? Less than 6 oz of lean meat, poultry, or fish each day. A 3-oz serving of meat is about the same size as a deck of cards. One egg equals 1 oz. ? 2 servings of low-fat dairy each day. ? A serving of nuts, seeds, or beans 5 times each week. ? Heart-healthy fats. Healthy fats called Omega-3 fatty acids are found in foods such as flaxseeds and coldwater fish, like sardines, salmon, and mackerel.  Limit how much you eat of the following: ? Canned or prepackaged foods. ? Food that is high in trans fat, such as fried foods. ? Food that is high in saturated fat, such as fatty meat. ? Sweets, desserts, sugary drinks, and other foods with added sugar. ? Full-fat dairy products.  Do not salt foods before eating.  Try to eat at least 2 vegetarian meals each week.  Eat more home-cooked food and less restaurant, buffet, and fast food.  When eating  at a restaurant, ask that your food be prepared with less salt or no salt, if possible. What foods are recommended? The items listed may not be a complete list. Talk with your dietitian about what dietary choices are best for you. Grains Whole-grain or whole-wheat bread. Whole-grain or whole-wheat pasta. Brown rice. [place]. Bulgur. Whole-grain and low-sodium cereals. Pita bread. Low-fat, low-sodium crackers. Whole-wheat flour tortillas. Vegetables Fresh or frozen vegetables (raw, steamed, roasted, or grilled). Low-sodium or reduced-sodium tomato and vegetable juice.  Low-sodium or reduced-sodium tomato sauce and tomato paste. Low-sodium or reduced-sodium canned vegetables. Fruits All fresh, dried, or frozen fruit. Canned fruit in natural juice (without added sugar). Meat and other protein foods Skinless chicken or [place]. Ground chicken or [place]. Pork with fat trimmed off. Fish and seafood. Egg whites. Dried beans, peas, or lentils. Unsalted nuts, nut butters, and seeds. Unsalted canned beans. Lean cuts of beef with fat trimmed off. Low-sodium, lean deli meat. Dairy Low-fat (1%) or fat-free (skim) milk. Fat-free, low-fat, or reduced-fat cheeses. Nonfat, low-sodium ricotta or cottage cheese. Low-fat or nonfat yogurt. Low-fat, low-sodium cheese. Fats and oils Soft margarine without trans fats. Vegetable oil. Low-fat, reduced-fat, or light mayonnaise and salad dressings (reduced-sodium). Canola, safflower, olive, soybean, and sunflower oils. Avocado. Seasoning and other foods Herbs. Spices. Seasoning mixes without salt. Unsalted popcorn and pretzels. Fat-free sweets. What foods are not recommended? The items listed may not be a complete list. Talk with your dietitian about what dietary choices are best for you. Grains Baked goods made with fat, such as croissants, muffins, or some breads. Dry pasta or rice meal packs. Vegetables Creamed or fried vegetables. Vegetables in a cheese sauce. Regular canned vegetables (not low-sodium or reduced-sodium). Regular canned tomato sauce and paste (not low-sodium or reduced-sodium). Regular tomato and vegetable juice (not low-sodium or reduced-sodium). Angie Fava. Olives. Fruits Canned fruit in a light or heavy syrup. Fried fruit. Fruit in cream or butter sauce. Meat and other protein foods Fatty cuts of meat. Ribs. Fried meat. Berniece Salines. Sausage. Bologna and other processed lunch meats. Salami. Fatback. Hotdogs. Bratwurst. Salted nuts and seeds. Canned beans with added salt. Canned or smoked fish. Whole eggs or egg yolks. Chicken  or [place] with skin. Dairy Whole or 2% milk, cream, and half-and-half. Whole or full-fat cream cheese. Whole-fat or sweetened yogurt. Full-fat cheese. Nondairy creamers. Whipped toppings. Processed cheese and cheese spreads. Fats and oils Butter. Stick margarine. Lard. Shortening. Ghee. Bacon fat. Tropical oils, such as coconut, palm kernel, or palm oil. Seasoning and other foods Salted popcorn and pretzels. Onion salt, garlic salt, seasoned salt, table salt, and sea salt. Worcestershire sauce. Tartar sauce. Barbecue sauce. Teriyaki sauce. Soy sauce, including reduced-sodium. Steak sauce. Canned and packaged gravies. Fish sauce. Oyster sauce. Cocktail sauce. Horseradish that you find on the shelf. Ketchup. Mustard. Meat flavorings and tenderizers. Bouillon cubes. Hot sauce and Tabasco sauce. Premade or packaged marinades. Premade or packaged taco seasonings. Relishes. Regular salad dressings. Where to find more information:  National Heart, Lung, and [place]: https://wilson-eaton.com/  American Heart Association: www.heart.org Summary  The DASH eating plan is a healthy eating plan that has been shown to reduce high blood pressure (hypertension). It may also reduce your risk for type 2 diabetes, heart disease, and stroke.  With the DASH eating plan, you should limit salt (sodium) intake to 2,300 mg a day. If you have hypertension, you may need to reduce your sodium intake to 1,500 mg a day.  When on the DASH eating plan, aim to  eat more fresh fruits and vegetables, whole grains, lean proteins, low-fat dairy, and heart-healthy fats.  Work with your health care provider or diet and nutrition specialist (dietitian) to adjust your eating plan to your individual calorie needs. This information is not intended to replace advice given to you by your health care provider. Make sure you discuss any questions you have with your health care provider. Document Released: 12/07/2010 Document Revised:  12/12/2015 Document Reviewed: 12/12/2015 Elsevier Interactive Patient Education  [employer].

## 2017-07-08 ENCOUNTER — Ambulatory Visit: Payer: Medicare Other | Admitting: Internal Medicine

## 2017-07-08 DIAGNOSIS — Z0289 Encounter for other administrative examinations: Secondary | ICD-10-CM

## 2017-08-01 ENCOUNTER — Encounter: Payer: Self-pay | Admitting: Internal Medicine

## 2017-08-01 ENCOUNTER — Ambulatory Visit (INDEPENDENT_AMBULATORY_CARE_PROVIDER_SITE_OTHER): Payer: Medicare Other | Admitting: Internal Medicine

## 2017-08-01 VITALS — BP 124/88 | HR 103 | Temp 98.2°F | Ht 71.5 in | Wt 224.0 lb

## 2017-08-01 DIAGNOSIS — K6289 Other specified diseases of anus and rectum: Secondary | ICD-10-CM | POA: Diagnosis not present

## 2017-08-01 MED ORDER — HYDROCORTISONE 2.5 % EX CREA: TOPICAL_CREAM | Freq: Three times a day (TID) | CUTANEOUS | 3 refills | Status: AC | PRN

## 2017-08-01 NOTE — Progress Notes (Signed)
Subjective:    Patient ID: Calvin Hall, male    DOB: 1945/05/28, 72 y.o.   MRN: [id number]  HPI Here due to rectal pain--with wife Started last week---when he moves his bowels Not constipated---goes pretty much every day No blood No abdominal pain  Current Outpatient Medications on File Prior to Visit  Medication Sig Dispense Refill  . amLODipine (NORVASC) 5 MG tablet Take 1 tablet (5 mg total) by mouth daily. 90 tablet 3  . benztropine (COGENTIN) 0.5 MG tablet Take 1 tablet by mouth at bedtime.    . haloperidol (HALDOL) 2 MG tablet Take 2 mg by mouth at bedtime.    . hydrALAZINE (APRESOLINE) 50 MG tablet Take 1 tablet (50 mg total) by mouth 2 (two) times daily. Reported on 06/30/2015 180 tablet 3  . losartan (COZAAR) 100 MG tablet Take 1 tablet (100 mg total) by mouth daily. 90 tablet 3  . sertraline (ZOLOFT) 50 MG tablet Take 50 mg by mouth at bedtime.    . triamterene-hydrochlorothiazide (MAXZIDE) 75-50 MG tablet TAKE 1 TABLET BY MOUTH DAILY FOR FLUID/BLOOD PRESSURE 90 tablet 3   No current facility-administered medications on file prior to visit.     No Known Allergies  Past Medical History:  Diagnosis Date  . Cervical spine arthritis with nerve pain   . Depression   . Hypertension     Past Surgical History:  Procedure Laterality Date  . NO PAST SURGERIES      Family History  Problem Relation Age of Onset  . Tuberculosis Mother   . Lung cancer Father   . Heart failure Sister   . Diabetes Sister        not sure    Social History   Socioeconomic History  . Marital status: Married    Spouse name: Not on file  . Number of children: 6  . Years of education: Not on file  . Highest education level: Not on file  Occupational History  . Occupation: Surveyor, miningKitchen work ---washing [employer]    Comment: Retired  Engineer, productionocial Needs  . Financial resource strain: Not on file  . Food insecurity:    Worry: Not on file    Inability: Not on file  . Transportation needs:   Medical: Not on file    Non-medical: Not on file  Tobacco Use  . Smoking status: Former Games developermoker  . Smokeless tobacco: Never Used  Substance and Sexual Activity  . Alcohol use: No    Alcohol/week: 0.0 oz  . Drug use: No  . Sexual activity: Never  Lifestyle  . Physical activity:    Days per week: Not on file    Minutes per session: Not on file  . Stress: Not on file  Relationships  . Social connections:    Talks on phone: Not on file    Gets together: Not on file    Attends religious service: Not on file    Active member of club or organization: Not on file    Attends meetings of clubs or organizations: Not on file    Relationship status: Not on file  . Intimate partner violence:    Fear of current or ex partner: Not on file    Emotionally abused: Not on file    Physically abused: Not on file    Forced sexual activity: Not on file  Other Topics Concern  . Not on file  Social History Narrative   No living will   Would want wife to make decision---alternate is  all children   Would accept resuscitation   No tube feeds if cognitively unaware    Review of Systems  Appetite is okay Weight is fairly stable     Objective:   Physical Exam  Genitourinary:  Genitourinary Comments: Mild perirectal irritation Some stool outside No hemorrhoids No fistula Very tender with internal but no masses (limited exam) Brown heme negative stool present           Assessment & Plan:

## 2017-08-01 NOTE — Assessment & Plan Note (Signed)
No worrisome findings Hemoccult is negative and only local pain Will try HC 2.5% cream tid If ongoing symptoms, should refer to GI for evaluation (he still prefers no cancer screening)

## 2017-08-08 ENCOUNTER — Telehealth: Payer: Self-pay | Admitting: Internal Medicine

## 2017-08-08 DIAGNOSIS — K6289 Other specified diseases of anus and rectum: Secondary | ICD-10-CM

## 2017-08-08 NOTE — Telephone Encounter (Signed)
Copied from CRM #[id number]. Topic: Quick [phone] - See Telephone Encounter >> Aug 08, 2017 10:54 AM Raquel SarnaHayes, Teresa G wrote: Pt's wife Lyla SonCarrie needing to speak w/ Dr Alphonsus SiasLetvak or nurse regarding pt.

## 2017-08-08 NOTE — Telephone Encounter (Signed)
I spoke Mrs Calvin Hall (DPR signed); pt was seen 08/01/17. Pt is still hurting and wants referral to GI.

## 2017-08-08 NOTE — Telephone Encounter (Signed)
Referral made 

## 2017-08-19 ENCOUNTER — Telehealth: Payer: Self-pay | Admitting: Internal Medicine

## 2017-08-19 NOTE — Telephone Encounter (Signed)
Copied from CRM [id number]. Topic: Quick Communication - See Telephone Encounter >> Aug 19, 2017  5:01 PM Raquel SarnaHayes, Teresa G wrote: Pt states that ointment is not really helping symptoms.  Pt is needing to know if there is anything else he can try for relief?

## 2017-08-20 NOTE — Telephone Encounter (Signed)
Pt was seen 08/01/17; Hydrocortisone cream not helping.Please advise.

## 2017-08-20 NOTE — Telephone Encounter (Signed)
Shirlee LimerickMarion, I do not see an appointment set up for him at a GI office. Can you follow-up?

## 2017-08-20 NOTE — Telephone Encounter (Signed)
I referred him to GI---see how that referral is going (this is the next step)

## 2017-08-20 NOTE — Telephone Encounter (Signed)
Patients wife changed referral to [facility] and it can take longer to get in with that group. Spoke with patients wife and gave her their contact information. They call the patients directly

## 2017-08-21 NOTE — Telephone Encounter (Signed)
Okay---can you follow up in the next few days and make sure they got contacted?

## 2017-08-21 NOTE — Telephone Encounter (Signed)
[facility] at 8:30am. Patients wife notified.

## 2017-08-22 NOTE — Telephone Encounter (Signed)
Great - thanks

## 2017-08-26 ENCOUNTER — Ambulatory Visit: Payer: Medicare Other | Admitting: Gastroenterology

## 2017-08-28 ENCOUNTER — Ambulatory Visit: Payer: Medicare Other | Admitting: Gastroenterology

## 2017-09-05 DIAGNOSIS — F250 Schizoaffective disorder, bipolar type: Secondary | ICD-10-CM | POA: Diagnosis not present

## 2017-11-14 DIAGNOSIS — F250 Schizoaffective disorder, bipolar type: Secondary | ICD-10-CM | POA: Diagnosis not present

## 2017-11-15 ENCOUNTER — Ambulatory Visit (INDEPENDENT_AMBULATORY_CARE_PROVIDER_SITE_OTHER): Payer: Medicare Other | Admitting: Internal Medicine

## 2017-11-15 ENCOUNTER — Encounter: Payer: Self-pay | Admitting: Internal Medicine

## 2017-11-15 VITALS — BP 136/88 | HR 98 | Temp 97.9°F | Ht 71.5 in | Wt 233.0 lb

## 2017-11-15 DIAGNOSIS — I10 Essential (primary) hypertension: Secondary | ICD-10-CM | POA: Diagnosis not present

## 2017-11-15 MED ORDER — AMLODIPINE BESYLATE 10 MG PO TABS: 10.0000 mg | ORAL_TABLET | Freq: Every day | ORAL | 3 refills | Status: AC

## 2017-11-15 NOTE — Patient Instructions (Signed)
Please increase the amlodipine to 10mg  daily. You can take 2 of the 5mg  tabs till they run out---then start the new prescription.

## 2017-11-15 NOTE — Progress Notes (Signed)
Subjective:    Patient ID: Calvin Hall, male    DOB: 04/26/1945, 72 y.o.   MRN: [id number]  HPI Here with wife due to concerns about his BP  BP running high Checked by psychiatrist yesterday  175/110 or something like that Didn't feel he was nervous Taking meds regularly  Current Outpatient Medications on File Prior to Visit  Medication Sig Dispense Refill  . amLODipine (NORVASC) 5 MG tablet Take 1 tablet (5 mg total) by mouth daily. 90 tablet 3  . benztropine (COGENTIN) 0.5 MG tablet Take 1 tablet by mouth at bedtime.    . haloperidol (HALDOL) 2 MG tablet Take 2 mg by mouth at bedtime.    . hydrALAZINE (APRESOLINE) 50 MG tablet Take 1 tablet (50 mg total) by mouth 2 (two) times daily. Reported on 06/30/2015 180 tablet 3  . hydrocortisone 2.5 % cream Apply topically 3 (three) times daily as needed. 28 g 3  . losartan (COZAAR) 100 MG tablet Take 1 tablet (100 mg total) by mouth daily. 90 tablet 3  . sertraline (ZOLOFT) 50 MG tablet Take 50 mg by mouth at bedtime.    . triamterene-hydrochlorothiazide (MAXZIDE) 75-50 MG tablet TAKE 1 TABLET BY MOUTH DAILY FOR FLUID/BLOOD PRESSURE 90 tablet 3   No current facility-administered medications on file prior to visit.     No Known Allergies  Past Medical History:  Diagnosis Date  . Cervical spine arthritis with nerve pain   . Depression   . Hypertension     Past Surgical History:  Procedure Laterality Date  . NO PAST SURGERIES      Family History  Problem Relation Age of Onset  . Tuberculosis Mother   . Lung cancer Father   . Heart failure Sister   . Diabetes Sister        not sure    Social History   Socioeconomic History  . Marital status: Married    Spouse name: Not on file  . Number of children: 6  . Years of education: Not on file  . Highest education level: Not on file  Occupational History  . Occupation: Surveyor, miningKitchen work ---washing [employer]    Comment: Retired  Engineer, productionocial Needs  . Financial resource strain: Not  on file  . Food insecurity:    Worry: Not on file    Inability: Not on file  . Transportation needs:    Medical: Not on file    Non-medical: Not on file  Tobacco Use  . Smoking status: Former Games developermoker  . Smokeless tobacco: Never Used  Substance and Sexual Activity  . Alcohol use: No    Alcohol/week: 0.0 standard drinks  . Drug use: No  . Sexual activity: Never  Lifestyle  . Physical activity:    Days per week: Not on file    Minutes per session: Not on file  . Stress: Not on file  Relationships  . Social connections:    Talks on phone: Not on file    Gets together: Not on file    Attends religious service: Not on file    Active member of club or organization: Not on file    Attends meetings of clubs or organizations: Not on file    Relationship status: Not on file  . Intimate partner violence:    Fear of current or ex partner: Not on file    Emotionally abused: Not on file    Physically abused: Not on file    Forced sexual activity: Not on file  Other Topics Concern  . Not on file  Social History Narrative   No living will   Would want wife to make decision---alternate is all children   Would accept resuscitation   No tube feeds if cognitively unaware   Review of Systems No headaches No chest pain No SOB No dizziness Right foot numb at times at night    Objective:   Physical Exam  Constitutional: He appears well-developed. No distress.  Neck: No thyromegaly present.  Cardiovascular: Normal rate, regular rhythm and normal heart sounds. Exam reveals no gallop.  No murmur heard. Respiratory: Effort normal and breath sounds normal. No respiratory distress. He has no wheezes. He has no rales.  Musculoskeletal: He exhibits no edema.  Lymphadenopathy:    He has no cervical adenopathy.           Assessment & Plan:

## 2017-11-15 NOTE — Assessment & Plan Note (Signed)
BP Readings from Last 3 Encounters:  11/15/17 136/88  08/01/17 124/88  06/05/17 (!) 162/98   Repeat 156/96 on right Unclear if he is just labile Will increase the amlodipine to 10mg  Follow up soon

## 2017-11-25 ENCOUNTER — Telehealth: Payer: Self-pay

## 2017-11-25 NOTE — Telephone Encounter (Signed)
Mrs Calvin Hall [facility](DPR signed) wants to verify that pt is supposed to be taking 4 BP meds; hydralazine 50 mg,Triamterene HCTZ 75-50 mg, Losartan 100 mg and Amlodipine 10 mg. Pt is presently taking meds as instructed but Mrs Calvin Hall wants to verify pt should be taking all these BP meds. Looking at 11/15/17 office notes do not see where any BP med was d/c but amlodipine was increased to 10 mg. Mrs Calvin Hall wants Dr Alphonsus SiasLetvak asked if should be on 4 BP meds also. Mrs Calvin Hall request cb by 11/26/17 at 9 AM.

## 2017-11-25 NOTE — Telephone Encounter (Signed)
Unfortunately I do think he needs all the medications for now We can review this at his next follow up (which was supposed to be fairly soon)

## 2017-11-25 NOTE — Telephone Encounter (Signed)
Spoke to Calvin Hall per DPR.

## 2017-12-13 ENCOUNTER — Ambulatory Visit: Payer: Medicare Other | Admitting: Internal Medicine

## 2018-01-22 ENCOUNTER — Other Ambulatory Visit: Payer: Self-pay | Admitting: Internal Medicine

## 2018-01-22 DIAGNOSIS — I10 Essential (primary) hypertension: Secondary | ICD-10-CM

## 2018-07-09 DIAGNOSIS — F250 Schizoaffective disorder, bipolar type: Secondary | ICD-10-CM | POA: Diagnosis not present

## 2018-10-01 DIAGNOSIS — F250 Schizoaffective disorder, bipolar type: Secondary | ICD-10-CM | POA: Diagnosis not present

## 2018-10-01 DIAGNOSIS — F39 Unspecified mood [affective] disorder: Secondary | ICD-10-CM | POA: Diagnosis not present

## 2019-06-09 ENCOUNTER — Encounter: Payer: Self-pay | Admitting: Internal Medicine

## 2019-06-09 ENCOUNTER — Ambulatory Visit (INDEPENDENT_AMBULATORY_CARE_PROVIDER_SITE_OTHER): Payer: Medicare Other | Admitting: Internal Medicine

## 2019-06-09 ENCOUNTER — Other Ambulatory Visit: Payer: Self-pay

## 2019-06-09 VITALS — BP 188/98 | HR 95 | Temp 97.7°F | Ht 71.5 in | Wt 259.1 lb

## 2019-06-09 DIAGNOSIS — L989 Disorder of the skin and subcutaneous tissue, unspecified: Secondary | ICD-10-CM

## 2019-06-09 DIAGNOSIS — B078 Other viral warts: Secondary | ICD-10-CM | POA: Diagnosis not present

## 2019-06-09 NOTE — Patient Instructions (Signed)
Warts ° °Warts are small growths on the skin. They are common, and they are caused by a virus. Warts can be found on many parts of the body. A person may have one wart or many warts. Most warts will go away on their own with time, but this could take many months to a few years. Treatments may be done if needed. °What are the causes? °Warts are caused by a type of virus that is called HPV. °· This virus can spread from person to person through touching. °· Warts can also spread to other parts of the body when a person scratches a wart and then scratches normal skin. °What increases the risk? °You are more likely to get warts if: °· You are 10-20 years old. °· You have a weak body defense system (immune system). °· You are Caucasian. °What are the signs or symptoms? °The main symptom of this condition is small growths on the skin. Warts may: °· Be round, oval, or have an uneven shape. °· Feel rough to the touch. °· Be the color of your skin or light yellow, brown, or gray. °· Often be less than ½ inch (1.3 cm) in size. °· Go away and then come back again. °Most warts do not hurt, but some can hurt if they are large or if they are on the bottom of your feet. °How is this diagnosed? °A wart can often be diagnosed by how it looks. In some cases, the doctor might remove a little bit of the wart to test it (biopsy). °How is this treated? °Most of the time, warts do not need treatment. Sometimes people want warts removed. If treatment is needed or wanted, options may include: °· Putting creams or patches with medicine in them on the wart. °· Putting duct tape over the top of the wart. °· Freezing the wart. °· Burning the wart with: °? A laser. °? An electric probe. °· Giving a shot of medicine into the wart to help the body's defense system fight off the wart. °· Surgery to remove the wart. °Follow these instructions at home: ° °Medicines °· Apply over-the-counter and prescription medicines only as told by your  doctor. °· Do not apply over-the-counter wart medicines to your face or genitals before you ask your doctor if it is okay to do that. °Lifestyle °· Keep your body's defense system healthy. To do this: °? Eat a healthy diet. °? Get enough sleep. °? Do not use any products that contain nicotine or tobacco, such as cigarettes and e-cigarettes. If you need help quitting, ask your doctor. °General instructions °· Wash your hands after you touch a wart. °· Do not scratch or pick at a wart. °· Avoid shaving hair that is over a wart. °· Keep all follow-up visits as told by your doctor. This is important. °Contact a doctor if: °· Your warts do not get better after treatment. °· You have redness, swelling, or pain at the site of a wart. °· You have bleeding from a wart, and the bleeding does not stop when you put light pressure on the wart. °· You have diabetes and you get a wart. °Summary °· Warts are small growths on the skin. They are common, and they are caused by a virus. °· Most of the time, warts do not need treatment. Sometimes people want warts removed. If treatment is needed or wanted, there are many options. °· Apply over-the-counter and prescription medicines only as told by your doctor. °· Wash   your hands after you touch a wart. °· Keep all follow-up visits as told by your doctor. This is important. °This information is not intended to replace advice given to you by your health care provider. Make sure you discuss any questions you have with your health care provider. °Document Revised: 05/07/2017 Document Reviewed: 05/07/2017 °Elsevier Patient Education © 2020 [employer]. ° °

## 2019-06-09 NOTE — Progress Notes (Signed)
Subjective:    Patient ID: Calvin Hall, male    DOB: 11-20-1945, 74 y.o.   MRN: [id number]  HPI  Pt presents to the clinic today with c/o warts on fingers of bilateral hand and left nostril. He noticed this a few months ago. The warts have not really changed, maybe has gotten a little bigger. He has tried Compound solution OTC with minimal relief.  Review of Systems      Past Medical History:  Diagnosis Date  . Cervical spine arthritis with nerve pain   . Depression   . Hypertension     Current Outpatient Medications  Medication Sig Dispense Refill  . amLODipine (NORVASC) 10 MG tablet Take 1 tablet (10 mg total) by mouth daily. 90 tablet 3  . benztropine (COGENTIN) 0.5 MG tablet Take 1 tablet by mouth at bedtime.    . haloperidol (HALDOL) 2 MG tablet Take 2 mg by mouth at bedtime.    . hydrALAZINE (APRESOLINE) 50 MG tablet Take 1 tablet (50 mg total) by mouth 2 (two) times daily. Reported on 06/30/2015 180 tablet 3  . hydrocortisone 2.5 % cream Apply topically 3 (three) times daily as needed. 28 g 3  . losartan (COZAAR) 100 MG tablet Take 1 tablet (100 mg total) by mouth daily. 90 tablet 3  . sertraline (ZOLOFT) 50 MG tablet Take 50 mg by mouth at bedtime.    . triamterene-hydrochlorothiazide (MAXZIDE) 75-50 MG tablet TAKE 1 TABLET BY MOUTH DAILY FOR FLUID/BLOOD PRESSURE 90 tablet 3   No current facility-administered medications for this visit.    No Known Allergies  Family History  Problem Relation Age of Onset  . Tuberculosis Mother   . Lung cancer Father   . Heart failure Sister   . Diabetes Sister        not sure    Social History   Socioeconomic History  . Marital status: Married    Spouse name: Not on file  . Number of children: 6  . Years of education: Not on file  . Highest education level: Not on file  Occupational History  . Occupation: Banker work ---washing [employer]    Comment: Retired  Tobacco Use  . Smoking status: Former Research scientist (life sciences)  .  Smokeless tobacco: Never Used  Substance and Sexual Activity  . Alcohol use: No    Alcohol/week: 0.0 standard drinks  . Drug use: No  . Sexual activity: Never  Other Topics Concern  . Not on file  Social History Narrative   No living will   Would want wife to make decision---alternate is all children   Would accept resuscitation   No tube feeds if cognitively unaware   Social Determinants of Health   Financial Resource Strain:   . Difficulty of Paying Living Expenses:   Food Insecurity:   . Worried About Charity fundraiser in the Last Year:   . Arboriculturist in the Last Year:   Transportation Needs:   . Film/video editor (Medical):   Marland Kitchen Lack of Transportation (Non-Medical):   Physical Activity:   . Days of Exercise per Week:   . Minutes of Exercise per Session:   Stress:   . Feeling of Stress :   Social Connections:   . Frequency of Communication with Friends and Family:   . Frequency of Social Gatherings with Friends and Family:   . Attends Religious Services:   . Active Member of Clubs or Organizations:   . Attends Archivist Meetings:   .  Marital Status:   Intimate Partner Violence:   . Fear of Current or Ex-Partner:   . Emotionally Abused:   Marland Kitchen Physically Abused:   . Sexually Abused:      Constitutional: Denies fever, malaise, fatigue, headache or abrupt weight changes.  Respiratory: Denies difficulty breathing, shortness of breath, cough or sputum production.   Cardiovascular: Denies chest pain, chest tightness, palpitations or swelling in the hands or feet.  Skin: Pt reports wart of fingers, left nostril. Denies redness, rashes, or ulcercations.    No other specific complaints in a complete review of systems (except as listed in HPI above).  Objective:   Physical Exam  BP (!) 188/98 (BP Location: Right Arm, Patient Position: Sitting, Cuff Size: Normal)   Pulse 95   Temp 97.7 F (36.5 C) (Temporal)   Ht 5' 11.5" (1.816 m)   Wt 259 lb  1.9 oz (117.5 kg)   SpO2 97%   BMI 35.64 kg/m   Wt Readings from Last 3 Encounters:  11/15/17 233 lb (105.7 kg)  08/01/17 224 lb (101.6 kg)  06/05/17 221 lb (100.2 kg)    General: Appears his stated age, obese in NAD. Skin: 3 mm wart noted on 3rd finger, left hand. Congruent wart noted just above nail bed on 2nd finger, left hand. 3 mm wart noted on 2nd finger right hand. Raised, jagged lesion noted at the left nare. Cardiovascular: Normal rate. Pulmonary/Chest: Normal effort. Neurological: Alert and oriented.    BMET    Component Value Date/Time   NA 138 03/11/2017 1226   K 3.8 03/11/2017 1226   CL 101 03/11/2017 1226   CO2 31 03/11/2017 1226   GLUCOSE 102 (H) 03/11/2017 1226   BUN 13 03/11/2017 1226   CREATININE 1.31 03/11/2017 1226   CREATININE 1.40 (H) 11/24/2014 1608   CALCIUM 9.9 03/11/2017 1226   GFRNONAA 32 (L) 09/21/2014 0503   GFRAA 37 (L) 09/21/2014 0503    Lipid Panel     Component Value Date/Time   CHOL 163 03/11/2017 1226   TRIG 145.0 03/11/2017 1226   HDL 44.90 03/11/2017 1226   CHOLHDL 4 03/11/2017 1226   VLDL 29.0 03/11/2017 1226   LDLCALC 89 03/11/2017 1226    CBC    Component Value Date/Time   WBC 6.3 03/11/2017 1226   RBC 5.75 03/11/2017 1226   HGB 16.4 03/11/2017 1226   HCT 47.9 03/11/2017 1226   PLT 245.0 03/11/2017 1226   MCV 83.4 03/11/2017 1226   MCH 28.3 11/24/2014 1608   MCHC 34.2 03/11/2017 1226   RDW 13.7 03/11/2017 1226   LYMPHSABS 0.9 (L) 09/20/2014 1251   MONOABS 2.2 (H) 09/20/2014 1251   EOSABS 0.0 09/20/2014 1251   BASOSABS 0.0 09/20/2014 1251    Hgb A1C Lab Results  Component Value Date   HGBA1C 6.0 (H) 11/24/2014           Assessment & Plan:   Common Wart:  3 warts frozen with liquid nitrogen, 2 freeze cycles of 8 sec each Advised pt that the warts may blister, scab and fall off If they do not, he will need to return in 2 weeks for additional treatment  Skin Lesion of Nose:  Referral to dermatology  placed for further evaluation  Return precautions discussed  Nicki Reaper, NP This visit occurred during the SARS-CoV-2 public health emergency.  Safety protocols were in place, including screening questions prior to the visit, additional usage of staff PPE, and extensive cleaning of exam room while observing appropriate  contact time as indicated for disinfecting solutions.

## 2019-06-15 ENCOUNTER — Telehealth: Payer: Self-pay | Admitting: Internal Medicine

## 2019-06-15 NOTE — Telephone Encounter (Signed)
Spoke w/Carrie, pt's spouse, Fri, 06-12-19.  Gave all information for today's visit w/Amber Register, PA w/[facility] and [employer].  Lyla Son repeated back all info about appt, to include date, time, phone and appt location.  Tried to contact pt and wife.  VM full.  Unable to leave msg.

## 2019-06-15 NOTE — Telephone Encounter (Signed)
Patient's wife Lyla Son called today  She stated that someone was going to get in contact with them about the patient having a wart removed from his nose. She stated that they have not heard from anyone and they are checking in on this

## 2019-06-15 NOTE — Telephone Encounter (Signed)
Calvin Hall, pt's wife called.  She got confused w/pt's other appts.  Gave her address location and phone# to call to r/s.  She repeated back address and phone# to call and noted that this was the "skin" MD for the place on pt's nose.

## 2019-06-17 ENCOUNTER — Telehealth: Payer: Self-pay | Admitting: Internal Medicine

## 2019-06-17 DIAGNOSIS — F339 Major depressive disorder, recurrent, unspecified: Secondary | ICD-10-CM

## 2019-06-17 NOTE — Telephone Encounter (Signed)
Pt's wife called reporting pt's psychiatrist has moved and he needs a new behavorial health physician in [place]. Advised a msg would be sent to Dr. Alphonsus Sias but to also contact the last behavioral health office to see if they can give a reference. She reported she has a hard time getting in touch with that office. Advised this office would f/u. Pt requested to only call her at home between 930-10am to assure she is home.

## 2019-06-17 NOTE — Telephone Encounter (Signed)
Pt's wife has some questions about her husband and would like a call back today. She did not want to go into detail with me on what the questions are about.

## 2019-06-18 NOTE — Addendum Note (Signed)
Addended by: Tillman Abide I on: 06/18/2019 12:29 PM   Modules accepted: Orders

## 2019-06-22 ENCOUNTER — Telehealth (INDEPENDENT_AMBULATORY_CARE_PROVIDER_SITE_OTHER): Payer: Medicare Other | Admitting: Internal Medicine

## 2019-06-22 ENCOUNTER — Other Ambulatory Visit: Payer: Self-pay

## 2019-06-22 ENCOUNTER — Encounter: Payer: Self-pay | Admitting: Internal Medicine

## 2019-06-22 DIAGNOSIS — R197 Diarrhea, unspecified: Secondary | ICD-10-CM | POA: Diagnosis not present

## 2019-06-22 NOTE — Progress Notes (Signed)
Virtual Visit via Telephone Note  I connected with Calvin Hall on 06/22/19 at  4:15 PM EDT by telephone and verified that I am speaking with the correct person using two identifiers.  Location: Patient: Home Provider: Office   I discussed the limitations, risks, security and privacy concerns of performing an evaluation and management service by telephone and the availability of in person appointments. I also discussed with the patient that there may be a patient responsible charge related to this service. The patient expressed understanding and agreed to proceed.   History of Present Illness:  Pt reports diarrhea. He reports this occurred 1 week ago. It last for a few days. He had multiple loose stools, without odor. He denies nausea, vomiting, abdominal pain, constipation or blood in his stool. He denies fever, chills or body aches. He denies recent changes in diet or medications. He took [employer] OTC with good relief of symptoms.   Past Medical History:  Diagnosis Date  . Cervical spine arthritis with nerve pain   . Depression   . Hypertension     Current Outpatient Medications  Medication Sig Dispense Refill  . amLODipine (NORVASC) 10 MG tablet Take 1 tablet (10 mg total) by mouth daily. 90 tablet 3  . benztropine (COGENTIN) 0.5 MG tablet Take 1 tablet by mouth at bedtime.    . haloperidol (HALDOL) 2 MG tablet Take 2 mg by mouth at bedtime.    . hydrALAZINE (APRESOLINE) 50 MG tablet Take 1 tablet (50 mg total) by mouth 2 (two) times daily. Reported on 06/30/2015 180 tablet 3  . hydrocortisone 2.5 % cream Apply topically 3 (three) times daily as needed. 28 g 3  . losartan (COZAAR) 100 MG tablet Take 1 tablet (100 mg total) by mouth daily. 90 tablet 3  . sertraline (ZOLOFT) 50 MG tablet Take 50 mg by mouth at bedtime.    . triamterene-hydrochlorothiazide (MAXZIDE) 75-50 MG tablet TAKE 1 TABLET BY MOUTH DAILY FOR FLUID/BLOOD PRESSURE 90 tablet 3   No current  facility-administered medications for this visit.    No Known Allergies  Family History  Problem Relation Age of Onset  . Tuberculosis Mother   . Lung cancer Father   . Heart failure Sister   . Diabetes Sister        not sure    Social History   Socioeconomic History  . Marital status: Married    Spouse name: Not on file  . Number of children: 6  . Years of education: Not on file  . Highest education level: Not on file  Occupational History  . Occupation: Surveyor, mining work ---washing [employer]    Comment: Retired  Tobacco Use  . Smoking status: Former Games developer  . Smokeless tobacco: Never Used  Substance and Sexual Activity  . Alcohol use: No    Alcohol/week: 0.0 standard drinks  . Drug use: No  . Sexual activity: Never  Other Topics Concern  . Not on file  Social History Narrative   No living will   Would want wife to make decision---alternate is all children   Would accept resuscitation   No tube feeds if cognitively unaware   Social Determinants of Health   Financial Resource Strain:   . Difficulty of Paying Living Expenses:   Food Insecurity:   . Worried About Programme researcher, broadcasting/film/video in the Last Year:   . Barista in the Last Year:   Transportation Needs:   . Freight forwarder (Medical):   Marland Kitchen  Lack of Transportation (Non-Medical):   Physical Activity:   . Days of Exercise per Week:   . Minutes of Exercise per Session:   Stress:   . Feeling of Stress :   Social Connections:   . Frequency of Communication with Friends and Family:   . Frequency of Social Gatherings with Friends and Family:   . Attends Religious Services:   . Active Member of Clubs or Organizations:   . Attends Archivist Meetings:   Marland Kitchen Marital Status:   Intimate Partner Violence:   . Fear of Current or Ex-Partner:   . Emotionally Abused:   Marland Kitchen Physically Abused:   . Sexually Abused:      Constitutional: Denies fever, malaise, fatigue, headache or abrupt weight changes.   Respiratory: Denies difficulty breathing, shortness of breath, cough or sputum production.   Cardiovascular: Denies chest pain, chest tightness, palpitations or swelling in the hands or feet.  Gastrointestinal: Pt reports diarrhea (resolved). Denies abdominal pain, bloating, constipation, or blood in the stool.    No other specific complaints in a complete review of systems (except as listed in HPI above).    Observations/Objective:  There were no vitals taken for this visit.  Wt Readings from Last 3 Encounters:  06/09/19 259 lb 1.9 oz (117.5 kg)  11/15/17 233 lb (105.7 kg)  08/01/17 224 lb (101.6 kg)    General: in NAD. Pulmonary/Chest: Normal effort. No respiratory distress.  Neurological: Alert and oriented.   BMET    Component Value Date/Time   NA 138 03/11/2017 1226   K 3.8 03/11/2017 1226   CL 101 03/11/2017 1226   CO2 31 03/11/2017 1226   GLUCOSE 102 (H) 03/11/2017 1226   BUN 13 03/11/2017 1226   CREATININE 1.31 03/11/2017 1226   CREATININE 1.40 (H) 11/24/2014 1608   CALCIUM 9.9 03/11/2017 1226   GFRNONAA 32 (L) 09/21/2014 0503   GFRAA 37 (L) 09/21/2014 0503    Lipid Panel     Component Value Date/Time   CHOL 163 03/11/2017 1226   TRIG 145.0 03/11/2017 1226   HDL 44.90 03/11/2017 1226   CHOLHDL 4 03/11/2017 1226   VLDL 29.0 03/11/2017 1226   LDLCALC 89 03/11/2017 1226    CBC    Component Value Date/Time   WBC 6.3 03/11/2017 1226   RBC 5.75 03/11/2017 1226   HGB 16.4 03/11/2017 1226   HCT 47.9 03/11/2017 1226   PLT 245.0 03/11/2017 1226   MCV 83.4 03/11/2017 1226   MCH 28.3 11/24/2014 1608   MCHC 34.2 03/11/2017 1226   RDW 13.7 03/11/2017 1226   LYMPHSABS 0.9 (L) 09/20/2014 1251   MONOABS 2.2 (H) 09/20/2014 1251   EOSABS 0.0 09/20/2014 1251   BASOSABS 0.0 09/20/2014 1251    Hgb A1C Lab Results  Component Value Date   HGBA1C 6.0 (H) 11/24/2014       Assessment and Plan:  Diarrhea:  Resolved per pt No further intervention needed  at this time Can continue Pepto Bismol OTC if reoccurs  Return precautions discussed  Follow Up Instructions:    I discussed the assessment and treatment plan with the patient. The patient was provided an opportunity to ask questions and all were answered. The patient agreed with the plan and demonstrated an understanding of the instructions.   The patient was advised to call back or seek an in-person evaluation if the symptoms worsen or if the condition fails to improve as anticipated.  I provided 5:52 minutes of non-face-to-face time during this encounter.  Webb Silversmith, NP

## 2019-06-22 NOTE — Patient Instructions (Signed)

## 2019-09-17 DIAGNOSIS — F209 Schizophrenia, unspecified: Secondary | ICD-10-CM | POA: Diagnosis not present

## 2019-09-21 ENCOUNTER — Telehealth: Payer: Self-pay | Admitting: Internal Medicine

## 2019-09-21 NOTE — Telephone Encounter (Signed)
Calvin Hall,[facility], called.  He faxed a rx request on 09/17/19.  [name] said he needs it signed and faxed back as soon as possible.

## 2019-09-21 NOTE — Telephone Encounter (Signed)
[facility] is a Medicare fraud.

## 2019-09-23 NOTE — Telephone Encounter (Signed)
I will not be filling out these forms and faxing them back as they are fraudulent.

## 2019-09-23 NOTE — Telephone Encounter (Signed)
Calvin Hall @ [address] medical  Best number [phone]  Needs forms back today

## 2019-10-15 DIAGNOSIS — F209 Schizophrenia, unspecified: Secondary | ICD-10-CM | POA: Diagnosis not present

## 2019-12-21 DIAGNOSIS — F329 Major depressive disorder, single episode, unspecified: Secondary | ICD-10-CM | POA: Diagnosis not present

## 2020-03-01 DEATH — deceased

## 2020-03-02 ENCOUNTER — Telehealth: Payer: Self-pay | Admitting: Internal Medicine

## 2020-03-02 NOTE — Telephone Encounter (Signed)
Please let her know that I never heard about him dying and haven't seen him in 2.5 years. I can't do the death certificate. It will need to be referred to the medical examiner. I let the funeral home know that  I am sorry for her loss

## 2020-03-02 NOTE — Telephone Encounter (Signed)
Tried to call pt's wife. VM is full.

## 2020-03-02 NOTE — Telephone Encounter (Signed)
Patient passed away on 2020/03/06. Patients wife wanted to know if you have signed off on the death certificate. Please advise. [facility] in [place]

## 2020-03-03 ENCOUNTER — Telehealth: Payer: Self-pay | Admitting: Internal Medicine

## 2020-03-03 NOTE — Telephone Encounter (Signed)
Medical examiner, Alison Stalling, with the state of , called from [facility]. He reported the pt's PCP has refused to sign the death certificate because he has not seen the pt in over 2 years. Read the msg PCP wrote and ME said there is a general state statute that says the PCP has to sign the death certificate and it clearly shows that the pt has not established care with a new  PCP. He also reported the certificate had to be signed by an MD and a psychiatrist. 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 He reports there was an original rejection of signing the death certificate given to the [facility] but could not say how this was communicated to the provider. ME said a NP or PA under the PCP could sign the certificate if they saw the pt. Advised a msg would be sent to PCP. ME reported he will have Jola Babinski at the funeral home set up the case for the pt in [place] so any provider could go in and take care of it. His number is [phone] if there are any questions.

## 2020-03-03 NOTE — Telephone Encounter (Signed)
Funeral home called back stating Dr Alphonsus Sias had to sign the death certificate per the medical examiner. They said they can see that he was seen in our office in 2021. He saw Nicki Reaper for a skin lesion and diarrhea another day. I advised them that those were problem visits. He has not seen Dr Alphonsus Sias for wellness exams or anything else in 2.5 years.

## 2020-03-03 NOTE — Telephone Encounter (Signed)
Spoke to pt's wife. Advised her to get with the funeral home director.

## 2020-03-03 NOTE — Telephone Encounter (Signed)
Jola Babinski called in wanted to know about relinquishing the death certificate on [place]:case number  [id number]

## 2020-03-03 NOTE — Telephone Encounter (Signed)
I tried to reach the wife but couldn't get her and the line wouldn't accept messages.  I called Jola Babinski at [place] and told her that he had recently been seen by psychiatrist (chronic lifelong illness). I mostly can't sign the death certificate because I was informed of the death 9 days later----and no one seems to know if a death scene investigation occurred (since it seems risk of suicide would be high). She told me the ME office was going to get back with her about who would sign

## 2020-03-04 NOTE — Telephone Encounter (Signed)
I have now done the death certificate given that the medical examiner has established a cause of death and it has been clarified that it was not suicide, etc
